# Patient Record
Sex: Male | Born: 1950 | Race: White | Hispanic: No | Marital: Married | Smoking: Never smoker
Health system: Southern US, Community
[De-identification: ages and names within clinical notes are randomized; demographics above are authoritative.]

## PROBLEM LIST (undated history)

## (undated) DIAGNOSIS — I10 Essential (primary) hypertension: Secondary | ICD-10-CM

## (undated) HISTORY — PX: KNEE SURGERY: SHX244

---

## 2005-10-04 ENCOUNTER — Encounter: Admission: RE | Admit: 2005-10-04 | Discharge: 2005-10-04 | Payer: Self-pay | Admitting: Family Medicine

## 2007-01-05 ENCOUNTER — Inpatient Hospital Stay (HOSPITAL_COMMUNITY): Admission: EM | Admit: 2007-01-05 | Discharge: 2007-01-06 | Payer: Self-pay | Admitting: Emergency Medicine

## 2007-01-05 ENCOUNTER — Encounter (INDEPENDENT_AMBULATORY_CARE_PROVIDER_SITE_OTHER): Payer: Self-pay | Admitting: Orthopedic Surgery

## 2010-03-12 ENCOUNTER — Encounter: Admission: RE | Admit: 2010-03-12 | Discharge: 2010-03-12 | Payer: Self-pay | Admitting: Internal Medicine

## 2010-10-19 NOTE — Op Note (Signed)
Kent Watts, VANOVER NO.:  [id number]   MEDICAL RECORD NO.:  [id number]          PATIENT TYPE:  INP   LOCATION:  1843                         FACILITY:  [facility]   PHYSICIAN:  Alvy Beal, MD    DATE OF BIRTH:  [birth year]   DATE OF PROCEDURE:  01/05/2007  DATE OF DISCHARGE:                               OPERATIVE REPORT   PREOPERATIVE DIAGNOSIS:  Right knee traumatic arthrotomy 2 days ago.   POSTOPERATIVE DIAGNOSIS:  Right knee traumatic arthrotomy 2 days ago.   OPERATIVE PROCEDURE:  I&D right knee.   COMPLICATIONS:  None.   CONDITION:  Stable.   HISTORY:  This is a very pleasant gentleman who 2 days ago suffered a  puncture wound to the right knee.  He was seen outside at urgent care.  The knee was washed out and closed.  He presents to the ER because of  increasing pain and some mild increased swelling.  A knee aspirate  revealed 5000 white blood cells and a hemarthrosis.  On the x-ray, there  was some evidence of some air inside the joint, and so we elected to  take him to the operating room for a formal I&D.  All appropriate risks,  benefits, and alternatives were explained to the patient.   OPERATIVE NOTE:  The patient was brought to the operating room and  placed supine on the operating table.  After successful induction of  general anesthesia with endotracheal intubation, the knee was prepped  and draped in a standard fashion.  A standard superomedial suprapatellar  portal was established using an 11 blade knife.  Once the trocar was  intraarticular, I then removed the sutures from the wound on the  inferior aspect of the patella and then extended the incision somewhat.  I then debrided the wound and then advanced trocar through the wound,  through the traumatic arthrotomy site and into the joint.  I then  irrigated the wound copiously with a total of 9 liters of saline through  the pump.  At no point in time was there any extravasation of fluid  into  the popliteal fossa.   Once the knee was washed out appropriately, I then irrigated the wound  itself and placed a drain into the joint.  I then loosely reapproximated  the wound edges.  Dry dressing and an Ace was applied, as was a knee  immobilizer.  The patient was then extubated and transferred to the PACU  without incident.  At the end of the case, all needle and sponge counts  were correct.      Alvy Beal, MD  Electronically Signed     DDB/MEDQ  D:  01/05/2007  T:  01/06/2007  Job:  [id number]

## 2010-10-19 NOTE — Op Note (Signed)
NAMEARLISS, FRISINA NO.:  [id number]   MEDICAL RECORD NO.:  [id number]          PATIENT TYPE:  INP   LOCATION:  1843                         FACILITY:  [facility]   PHYSICIAN:  Alvy Beal, MD    DATE OF BIRTH:  03-18-1951   DATE OF PROCEDURE:  DATE OF DISCHARGE:                               OPERATIVE REPORT   Audio too short to transcribe (less than 5 seconds)      Alvy Beal, MD     DDB/MEDQ  D:  01/05/2007  T:  01/05/2007  Job:  [id number]

## 2010-10-19 NOTE — Consult Note (Signed)
NAMEXYLON, CROOM NO.:  [id number]   MEDICAL RECORD NO.:  [id number]          PATIENT TYPE:  INP   LOCATION:  5029                         FACILITY:  [facility]   PHYSICIAN:  Alvy Beal, MD    DATE OF BIRTH:  May 20, 1951   DATE OF CONSULTATION:  01/05/2007  DATE OF DISCHARGE:                                 CONSULTATION   ORTHOPEDIC SURGERY CONSULTATION:   REASON FOR CONSULTATION:  Consultation requested per the ER staff for  evaluation of right knee injury.   HISTORY:  Kent Watts is a very pleasant gentleman who two days ago fell  from a step ladder and injured himself.  He had his knee strike a metal  object and injured his right knee.  As a result he was seen in the  [facility].  They irrigated out the anterior right knee injury  and sutured it with three sutures.  He then called my office today and  was told to be seen and presented to the emergency room.  I was  contacted by the ER staff concerning this.   At this point in time, his major concern is just some knee pain.  He  noted that there was swelling almost immediately afterwards in the knee  and he has been on OxyContin which has managed his pain.   According to his wife, he has never had any fevers or chills or  significant redness in the knee.   They did note that up until my evaluation, he has been having difficulty  bending the knee and he is currently ambulating with a walker.   PAST MEDICAL HISTORY:  Essentially unremarkable.  He denies any  significant medical problems.   PAST SURGICAL HISTORY:  Essentially unremarkable.   FAMILY HISTORY:  Essentially unremarkable.   CURRENT MEDICATIONS:  He is just taking the OxyContin and Zoloft.   SOCIAL HISTORY:  He denies any tobacco use or significant alcohol use.   CLINICAL EXAM:  GENERAL:  He is a pleasant gentleman who appears his  stated age.  EXTREMITIES:  He has a small anterior knee laceration that was repaired  with three  sutures.  It is clean.  It is dry and intact.  There is no  erythema.  I am able to flex and extend the knee passively, and he is  able to do it actively without any severe pain.  He does have difficulty  and pain when he weight bears.  NEUROLOGICAL:  His neurological exam is intact with tibialis anterior,  EHL and gastrocnemius 5/5.  Capillary refill less than 2 seconds.   PROCEDURE:  At this point in time, under sterile conditions, I aspirated  the knee and got 50 mL of blood.  The patient had a hemarthrosis, which  I have now aspirated.  There was no purulent material noted on the  aspiration.   PLAN:  At this point in time, the patient may have an intraarticular  meniscal or ACL injury causing his hemarthrosis.  At this point, I am  going to obtain some new x-rays of his knee and  an MRI of his knee.  I  have also sent the aspiration off for a stat Gram stain.  If it appears  to return back with bacteria or a significant white blood cell count,  then I will take him to the operating room for a formal I&D.  However,  clinically the knee does not appear to be infected.  He has no severe  pain with range of motion.  There is no significant tenderness with  palpation.  There is also no purulent drainage on aspiration.  At this  point we will continue to keep him n.p.o. until we get the Gram stain  and MRI results back.  Further recommendations pending the review of the  studies.      Alvy Beal, MD  Electronically Signed     DDB/MEDQ  D:  01/05/2007  T:  01/06/2007  Job:  [id number]

## 2010-10-22 NOTE — Discharge Summary (Signed)
NAMECLAUD, Kent Watts NO.:  [id number]   MEDICAL RECORD NO.:  [id number]          PATIENT TYPE:  INP   LOCATION:  5029                         FACILITY:  [facility]   PHYSICIAN:  Alvy Beal, MD    DATE OF BIRTH:  26-Mar-1951   DATE OF ADMISSION:  01/05/2007  DATE OF DISCHARGE:  01/06/2007                               DISCHARGE SUMMARY   PREOPERATIVE DIAGNOSIS:  Right knee traumatic arthrotomy.   POSTOPERATIVE DIAGNOSIS:  Right knee traumatic arthrotomy.   DISCHARGE DIAGNOSIS:  Right knee traumatic arthrotomy.  No other  significant medical comorbidities is noted.   HISTORY:  This is a very pleasant gentleman who presented to the  emergency room after suffering a puncture wound to the right knee.  He  indicates that this occurred two days prior to his admission.  There was  significant knee swelling, pain and difficulty with range of motion.  He  was seen at urgent care and then sent to the emergency room for further  evaluation.   A knee aspirate revealed 5000 white blood cells and hemarthrosis.  X-ray  showed some evidence of some air inside the joint.  Because of the  traumatic nature of the injury, I elected to take the patient to the  operating room for a formal I&D.   His admitting exam noted significant swelling and erythema, pain with  range of motion, but no gross instability with varus and valgus stress  testing, negative Lachman exam.  His neurovascular exam was intact with  EHL, tibialis, anterior gastrocnemius 5/5, sensation in the cast and  foot was intact, pulses in the dorsalis pedis and posterior tibialis  were intact.   All appropriate risks, benefits and alternatives were explained to the  patient and he was taken to the operating room on January 05, 2007 for an  I&D.   The patient tolerated this procedure well and was admitted to the floor  for IV antibiotic treatment.   His hospital course was essentially unremarkable.  He was  evaluated by  physical therapy on postop day two.  He was independently ambulating and  doing stairs, and he was discharged with no further acute physical  therapy needs.  He was noted to be afebrile throughout the course.  His  cultures remained negative.  He was discharged to home on p.o.  antibiotics and instructed to follow up with me as an outpatient.  Appropriate post discharge pain medications were provided.      Alvy Beal, MD  Electronically Signed     DDB/MEDQ  D:  02/13/2007  T:  02/13/2007  Job:  [id number]

## 2011-03-21 LAB — I-STAT 8, (EC8 V) (CONVERTED LAB)
BUN: 16
Bicarbonate: 29.6 — ABNORMAL HIGH
HCT: 45
Operator id: 272551
TCO2: 31
pCO2, Ven: 55.7 — ABNORMAL HIGH

## 2011-03-21 LAB — BODY FLUID CULTURE: Culture: NO GROWTH

## 2011-03-21 LAB — SYNOVIAL CELL COUNT + DIFF, W/ CRYSTALS
Eosinophils-Synovial: 0
Lymphocytes-Synovial Fld: 28 — ABNORMAL HIGH
Monocyte-Macrophage-Synovial Fluid: 11 — ABNORMAL LOW
Neutrophil, Synovial: 61 — ABNORMAL HIGH
WBC, Synovial: 5060 — ABNORMAL HIGH

## 2011-03-21 LAB — GRAM STAIN

## 2019-05-06 ENCOUNTER — Other Ambulatory Visit: Payer: Self-pay | Admitting: Physician Assistant

## 2019-05-06 ENCOUNTER — Other Ambulatory Visit: Payer: Self-pay

## 2019-05-06 ENCOUNTER — Ambulatory Visit
Admission: RE | Admit: 2019-05-06 | Discharge: 2019-05-06 | Disposition: A | Discharge status: Home / Self Care | Payer: Self-pay | Source: Ambulatory Visit | Attending: Physician Assistant | Admitting: Physician Assistant

## 2019-05-06 DIAGNOSIS — G8929 Other chronic pain: Secondary | ICD-10-CM

## 2019-06-11 ENCOUNTER — Ambulatory Visit: Payer: Medicare HMO | Attending: Internal Medicine

## 2019-06-11 DIAGNOSIS — Z20822 Contact with and (suspected) exposure to covid-19: Secondary | ICD-10-CM

## 2019-06-13 LAB — NOVEL CORONAVIRUS, NAA: SARS-CoV-2, NAA: NOT DETECTED

## 2019-07-13 ENCOUNTER — Ambulatory Visit: Payer: Medicare HMO | Attending: Internal Medicine

## 2019-07-13 DIAGNOSIS — Z23 Encounter for immunization: Secondary | ICD-10-CM

## 2019-07-13 NOTE — Progress Notes (Signed)
   Covid-19 Vaccination Clinic  Name:  Kent Watts    MRN: [id number] DOB: 1950/07/17  07/13/2019  [name] was observed post Covid-19 immunization for 15 minutes without incidence. He was provided with Vaccine Information Sheet and instruction to access the V-Safe system.   [name] was instructed to call 911 with any severe reactions post vaccine: Marland Kitchen Difficulty breathing  . Swelling of your face and throat  . A fast heartbeat  . A bad rash all over your body  . Dizziness and weakness    Immunizations Administered    Name Date Dose VIS Date Route   Pfizer COVID-19 Vaccine 07/13/2019  6:31 PM 0.3 mL 05/17/2019 Intramuscular   Manufacturer: [employer], [employer]   Lot: [id number]   NDC: [address]-2

## 2019-07-29 ENCOUNTER — Ambulatory Visit: Payer: Medicare HMO

## 2019-08-07 ENCOUNTER — Ambulatory Visit: Payer: Medicare HMO | Attending: Internal Medicine

## 2019-08-07 ENCOUNTER — Ambulatory Visit: Payer: Medicare HMO

## 2019-08-07 DIAGNOSIS — Z23 Encounter for immunization: Secondary | ICD-10-CM

## 2019-08-07 NOTE — Progress Notes (Signed)
   Covid-19 Vaccination Clinic  Name:  Kent Watts    MRN: [id number] DOB: [birth year]  08/07/2019  Kent Watts was observed post Covid-19 immunization for 15 minutes without incident. He was provided with Vaccine Information Sheet and instruction to access the V-Safe system.   Kent Watts was instructed to call 911 with any severe reactions post vaccine: Marland Kitchen Difficulty breathing  . Swelling of face and throat  . A fast heartbeat  . A bad rash all over body  . Dizziness and weakness   Immunizations Administered    Name Date Dose VIS Date Route   Pfizer COVID-19 Vaccine 08/07/2019  9:53 AM 0.3 mL 05/17/2019 Intramuscular   Manufacturer: [employer], [employer]   Lot: [id number]   NDC: [address]-6

## 2020-02-14 ENCOUNTER — Emergency Department (HOSPITAL_COMMUNITY)
Admission: EM | Admit: 2020-02-14 | Discharge: 2020-02-14 | Disposition: A | Discharge status: Home / Self Care | Payer: Medicare HMO | Attending: Emergency Medicine | Admitting: Emergency Medicine

## 2020-02-14 ENCOUNTER — Emergency Department (HOSPITAL_COMMUNITY): Payer: Medicare HMO

## 2020-02-14 ENCOUNTER — Other Ambulatory Visit: Payer: Self-pay

## 2020-02-14 ENCOUNTER — Encounter (HOSPITAL_COMMUNITY): Payer: Self-pay

## 2020-02-14 DIAGNOSIS — W010XXA Fall on same level from slipping, tripping and stumbling without subsequent striking against object, initial encounter: Secondary | ICD-10-CM | POA: Insufficient documentation

## 2020-02-14 DIAGNOSIS — W19XXXA Unspecified fall, initial encounter: Secondary | ICD-10-CM

## 2020-02-14 DIAGNOSIS — Y9289 Other specified places as the place of occurrence of the external cause: Secondary | ICD-10-CM | POA: Diagnosis not present

## 2020-02-14 DIAGNOSIS — I10 Essential (primary) hypertension: Secondary | ICD-10-CM | POA: Diagnosis not present

## 2020-02-14 DIAGNOSIS — R6884 Jaw pain: Secondary | ICD-10-CM | POA: Diagnosis not present

## 2020-02-14 DIAGNOSIS — Y999 Unspecified external cause status: Secondary | ICD-10-CM | POA: Diagnosis not present

## 2020-02-14 DIAGNOSIS — Y9301 Activity, walking, marching and hiking: Secondary | ICD-10-CM | POA: Diagnosis not present

## 2020-02-14 HISTORY — DX: Essential (primary) hypertension: I10

## 2020-02-14 NOTE — Discharge Instructions (Signed)
Take Tylenol and Ibuprofen as needed for pain. Jaw compress to jaw. Soft foods and increase diet as tolerated  Follow up with the surgeon if new or worsening symptoms

## 2020-02-14 NOTE — ED Provider Notes (Signed)
[facility]-EMERGENCY DEPT Provider Note   CSN: [id number] Arrival date & time: 02/14/20  1504    History Chief Complaint  Patient presents with  . Fall  . Jaw Pain   Kent Watts is a 69 y.o. male with past medical history significant for hypertension who presents for evaluation of mechanical fall.  Patient states 5 days ago he was walking his dog when he tripped and fell over his feet and landed and hit the mandible of his jaw on the ground.  Patient states since then he has had right-sided jaw pain.  Worse when he opens and closes his jaw.  He is unable to tolerate p.o. intake however with increased pain.  He has been taking 3 Advil twice daily for this.  He rates his current pain a 6/10.  He does not want anything for pain at this time.  Patient states he also has some tenderness to the bridge of his nose.  He denies any epistaxis, vision changes, loose dentition or any intraoral lacerations.  He denies any headache, neck pain, chest pain, shortness of breath, abdominal pain, diarrhea, dysuria, extremity pain, back pain. He denies any drooling, dysphagia or trismus.  Denies additional aggravating or alleviating factors.  He is not currently followed by maxillofacial surgery or ENT. He did recently have a tooth pulled by dentistry.  History obtained from patient and past medical records.  No interpreter used.  HPI     Past Medical History:  Diagnosis Date  . Hypertension     There are no problems to display for this patient.   Past Surgical History:  Procedure Laterality Date  . KNEE SURGERY Right        Family History  Problem Relation Age of Onset  . Diabetes Mother   . Cancer Father     Social History   Tobacco Use  . Smoking status: Never Smoker  . Smokeless tobacco: Never Used  Vaping Use  . Vaping Use: Never used  Substance Use Topics  . Alcohol use: Yes  . Drug use: Never    Home Medications Prior to Admission medications   Not on  File    Allergies    Patient has no known allergies.  Review of Systems   Review of Systems  Constitutional: Negative.   HENT: Negative for congestion, dental problem, ear pain, facial swelling, postnasal drip, rhinorrhea, sinus pressure, sinus pain, sore throat, trouble swallowing and voice change.        Right mandible pain  Respiratory: Negative.   Cardiovascular: Negative.   Gastrointestinal: Negative.   Genitourinary: Negative.   Musculoskeletal: Negative.   Skin: Negative.   Neurological: Negative.   All other systems reviewed and are negative.   Physical Exam Updated Vital Signs BP 140/70 (BP Location: Right Arm)   Pulse 66   Temp 99 F (37.2 C) (Oral)   Resp 16   Ht 5\' 11"  (1.803 m)   Wt 83.9 kg   SpO2 98%   BMI 25.80 kg/m   Physical Exam Vitals and nursing note reviewed.  Constitutional:      General: He is not in acute distress.    Appearance: He is well-developed. He is not ill-appearing, toxic-appearing or diaphoretic.  HENT:     Head: Normocephalic and atraumatic. No raccoon eyes, Battle's sign, abrasion, contusion or masses.     Jaw: Tenderness present. No trismus or swelling.      Comments: Opens fingers to 3 finger breaths.  Some tenderness over  right mandible.  Tongue midline.  No loose dentition.  No intraoral edema    Right Ear: No hemotympanum.     Left Ear: No hemotympanum.     Nose: Nasal tenderness present. No laceration, mucosal edema, congestion or rhinorrhea.     Right Nostril: No foreign body, epistaxis or septal hematoma.     Left Nostril: No foreign body, epistaxis, septal hematoma or occlusion.     Right Sinus: No frontal sinus tenderness.     Left Sinus: No frontal sinus tenderness.      Comments: Mild tenderness over midline nasal bridge.  He has no septal hematoma.  No crepitus or step-offs    Mouth/Throat:     Lips: Pink.     Mouth: Mucous membranes are moist.     Pharynx: Oropharynx is clear. Uvula midline.     Tonsils: 0 on  the right. 0 on the left.     Comments: No loose dentition.  Open fingers to 3 finger breaths.  Uvula midline.  Old ecchymosis to inner lower lip mucosa however no obvious lacerations. No periapical abscess Eyes:     Pupils: Pupils are equal, round, and reactive to light.  Neck:     Trachea: Trachea and phonation normal.     Comments: No neck stiffness or neck rigidity.  No midline cervical tenderness, crepitus or step-offs Cardiovascular:     Rate and Rhythm: Normal rate and regular rhythm.     Pulses: Normal pulses.          Radial pulses are 2+ on the right side and 2+ on the left side.       Dorsalis pedis pulses are 2+ on the right side and 2+ on the left side.       Posterior tibial pulses are 2+ on the right side and 2+ on the left side.     Heart sounds: Normal heart sounds.  Pulmonary:     Effort: Pulmonary effort is normal. No respiratory distress.     Breath sounds: Normal breath sounds and air entry.  Abdominal:     General: Bowel sounds are normal. There is no distension.     Palpations: Abdomen is soft.     Tenderness: There is no abdominal tenderness. There is no right CVA tenderness, left CVA tenderness, guarding or rebound. Negative signs include Murphy's sign and McBurney's sign.     Hernia: No hernia is present.  Musculoskeletal:        General: Normal range of motion.     Cervical back: Full passive range of motion without pain, normal range of motion and neck supple.     Comments: Moves all 4 extremities without difficulty. No bony tenderness to extremities.  Feet:     Right foot:     Skin integrity: Skin integrity normal.     Left foot:     Skin integrity: Skin integrity normal.  Lymphadenopathy:     Cervical: No cervical adenopathy.  Skin:    General: Skin is warm and dry.  Neurological:     Mental Status: He is alert.     Comments: CN 2-12 grossly intact No facial droop Intact sensation to BL upper and lower extremities. Ambulatory without difficulty      ED Results / Procedures / Treatments   Labs (all labs ordered are listed, but only abnormal results are displayed) Labs Reviewed - No data to display  EKG None  Radiology CT Maxillofacial Wo Contrast  Result Date: 02/14/2020 CLINICAL DATA:  Fell face forward, right-sided facial trauma EXAM: CT MAXILLOFACIAL WITHOUT CONTRAST TECHNIQUE: Multidetector CT imaging of the maxillofacial structures was performed. Multiplanar CT image reconstructions were also generated. COMPARISON:  None. FINDINGS: Osseous: No acute displaced fractures. Orbits: Negative. No traumatic or inflammatory finding. Sinuses: Clear. Soft tissues: Negative. Limited intracranial: No significant or unexpected finding. IMPRESSION: 1. No acute facial bone fracture. Electronically Signed   By: Sharlet Salina M.D.   On: 02/14/2020 17:08    Procedures Procedures (including critical care time)  Medications Ordered in ED Medications - No data to display  ED Course  I have reviewed the triage vital signs and the nursing notes.  Pertinent labs & imaging results that were available during my care of the patient were reviewed by me and considered in my medical decision making (see chart for details).  69 year old presents for evaluation after mechanical fall 5 days ago with subsequent right-sided mandible pain.  He is afebrile, nonseptic, non-ill-appearing.  He is able to open up his jaw to 3 finger breaths.  No drooling, dysphagia, trismus. No evidence of intraoral lacerations.  No loose dentition.  Has a nonfocal neuro exam without deficits.  No evidence of gross traumatic head injury.  His midline cervical spine is without tenderness, crepitus.  He has no bony extremity tenderness.  He is no musculoskeletal exam.  He has been able to tolerate p.o. intake at home without difficulty.  Plan on CT max face and reassess  CT max/ face without fracture, question soft tissue injury as cause of his pain. No evidence of acute infectious  process.  He is tolerating p.o. intake here in the ED without difficulty.  Discussed Tylenol, NSAIDs and close follow-up with ENT or max face surgery if his symptoms do not resolve. No CP, SOB, neck pain, dizziness, weakness to suggest atypical cardiac, vascular etiology of his pain.  The patient has been appropriately medically screened and/or stabilized in the ED. I have low suspicion for any other emergent medical condition which would require further screening, evaluation or treatment in the ED or require inpatient management.  Patient is hemodynamically stable and in no acute distress.  Patient able to ambulate in department prior to ED.  Evaluation does not show acute pathology that would require ongoing or additional emergent interventions while in the emergency department or further inpatient treatment.  I have discussed the diagnosis with the patient and answered all questions.  Pain is been managed while in the emergency department and patient has no further complaints prior to discharge.  Patient is comfortable with plan discussed in room and is stable for discharge at this time.  I have discussed strict return precautions for returning to the emergency department.  Patient was encouraged to follow-up with PCP/specialist refer to at discharge.    MDM Rules/Calculators/A&P                           Final Clinical Impression(s) / ED Diagnoses Final diagnoses:  Fall, initial encounter  Mandible pain    Rx / DC Orders ED Discharge Orders    None       Daquane Aguilar A, PA-C 02/14/20 1748    Linwood Dibbles, MD 02/15/20 1658

## 2020-02-14 NOTE — ED Triage Notes (Addendum)
Patient states he tripped on something while walking with his dogs 5 days ago. Patient states he fell face front on asphalt. Patient denies LOC.. Patient c/o nose, right jaw , and forehead pain. patient states some discomfort when opening his mouth. Patient states he has been wearing a stretchy head band at night to help with pain so he can sleep better at night.

## 2021-07-13 DIAGNOSIS — H52223 Regular astigmatism, bilateral: Secondary | ICD-10-CM | POA: Diagnosis not present

## 2021-07-13 DIAGNOSIS — H524 Presbyopia: Secondary | ICD-10-CM | POA: Diagnosis not present

## 2021-07-15 DIAGNOSIS — H524 Presbyopia: Secondary | ICD-10-CM | POA: Diagnosis not present

## 2021-07-15 DIAGNOSIS — H52223 Regular astigmatism, bilateral: Secondary | ICD-10-CM | POA: Diagnosis not present

## 2021-08-23 ENCOUNTER — Emergency Department (HOSPITAL_BASED_OUTPATIENT_CLINIC_OR_DEPARTMENT_OTHER): Payer: Medicare HMO

## 2021-08-23 ENCOUNTER — Emergency Department (HOSPITAL_BASED_OUTPATIENT_CLINIC_OR_DEPARTMENT_OTHER)
Admission: EM | Admit: 2021-08-23 | Discharge: 2021-08-23 | Disposition: A | Discharge status: Home / Self Care | Payer: Medicare HMO | Attending: Emergency Medicine | Admitting: Emergency Medicine

## 2021-08-23 ENCOUNTER — Encounter (HOSPITAL_BASED_OUTPATIENT_CLINIC_OR_DEPARTMENT_OTHER): Payer: Self-pay

## 2021-08-23 ENCOUNTER — Other Ambulatory Visit: Payer: Self-pay

## 2021-08-23 DIAGNOSIS — K529 Noninfective gastroenteritis and colitis, unspecified: Secondary | ICD-10-CM | POA: Insufficient documentation

## 2021-08-23 DIAGNOSIS — R112 Nausea with vomiting, unspecified: Secondary | ICD-10-CM | POA: Diagnosis not present

## 2021-08-23 DIAGNOSIS — N200 Calculus of kidney: Secondary | ICD-10-CM | POA: Diagnosis not present

## 2021-08-23 DIAGNOSIS — D72829 Elevated white blood cell count, unspecified: Secondary | ICD-10-CM | POA: Diagnosis not present

## 2021-08-23 DIAGNOSIS — R000 Tachycardia, unspecified: Secondary | ICD-10-CM | POA: Diagnosis not present

## 2021-08-23 LAB — COMPREHENSIVE METABOLIC PANEL
ALT: 19 U/L (ref 0–44)
AST: 18 U/L (ref 15–41)
Albumin: 4.9 g/dL (ref 3.5–5.0)
Alkaline Phosphatase: 92 U/L (ref 38–126)
Anion gap: 13 (ref 5–15)
BUN: 22 mg/dL (ref 8–23)
CO2: 26 mmol/L (ref 22–32)
Calcium: 10 mg/dL (ref 8.9–10.3)
Chloride: 100 mmol/L (ref 98–111)
Creatinine, Ser: 1.17 mg/dL (ref 0.61–1.24)
GFR, Estimated: >60 mL/min (ref >60)
Glucose, Bld: 139 mg/dL — ABNORMAL HIGH (ref 70–99)
Potassium: 3.5 mmol/L (ref 3.5–5.1)
Sodium: 139 mmol/L (ref 135–145)
Total Bilirubin: 1.2 mg/dL (ref 0.3–1.2)
Total Protein: 8.1 g/dL (ref 6.5–8.1)

## 2021-08-23 LAB — URINALYSIS, ROUTINE W REFLEX MICROSCOPIC
Bilirubin Urine: NEGATIVE
Glucose, UA: NEGATIVE mg/dL
Hgb urine dipstick: NEGATIVE
Ketones, ur: 40 mg/dL — AB
Leukocytes,Ua: NEGATIVE
Nitrite: NEGATIVE
Protein, ur: 30 mg/dL — AB
Specific Gravity, Urine: 1.031 — ABNORMAL HIGH (ref 1.005–1.030)
pH: 6 (ref 5.0–8.0)

## 2021-08-23 LAB — CBC
HCT: 46.5 % (ref 39.0–52.0)
Hemoglobin: 16.3 g/dL (ref 13.0–17.0)
MCH: 31.8 pg (ref 26.0–34.0)
MCHC: 35.1 g/dL (ref 30.0–36.0)
MCV: 90.6 fL (ref 80.0–100.0)
Platelets: 265 10*3/uL (ref 150–400)
RBC: 5.13 MIL/uL (ref 4.22–5.81)
RDW: 12.7 % (ref 11.5–15.5)
WBC: 15.5 10*3/uL — ABNORMAL HIGH (ref 4.0–10.5)
nRBC: 0 % (ref 0.0–0.2)

## 2021-08-23 LAB — LIPASE, BLOOD: Lipase: 10 U/L — ABNORMAL LOW (ref 11–51)

## 2021-08-23 MED ORDER — ONDANSETRON HCL 4 MG/2ML IJ SOLN
4.0000 mg | Freq: Once | INTRAMUSCULAR | Status: AC
  Administered 2021-08-23: 4 mg via INTRAVENOUS
  Filled 2021-08-23: qty 2

## 2021-08-23 MED ORDER — LOPERAMIDE HCL 2 MG PO CAPS
2.0000 mg | ORAL_CAPSULE | Freq: Four times a day (QID) | ORAL | 0 refills | Status: AC | PRN
Start: 2021-08-23 — End: ?

## 2021-08-23 MED ORDER — IOHEXOL 300 MG/ML  SOLN
100.0000 mL | Freq: Once | INTRAMUSCULAR | Status: AC | PRN
  Administered 2021-08-23: 100 mL via INTRAVENOUS

## 2021-08-23 MED ORDER — ONDANSETRON 8 MG PO TBDP: 8.0000 mg | ORAL_TABLET | Freq: Three times a day (TID) | ORAL | 0 refills | Status: DC | PRN

## 2021-08-23 MED ORDER — SODIUM CHLORIDE 0.9 % IV BOLUS (SEPSIS)
1000.0000 mL | Freq: Once | INTRAVENOUS | Status: AC
Start: 2021-08-23 — End: 2021-08-23
  Administered 2021-08-23: 1000 mL via INTRAVENOUS

## 2021-08-23 MED ORDER — SODIUM CHLORIDE 0.9 % IV SOLN: 1000.0000 mL | INTRAVENOUS | Status: DC

## 2021-08-23 NOTE — ED Triage Notes (Signed)
Patient here POV from Home with N/V/D.  ? ?Patient endorses Constant N/V/D. For approximately 12 hours. Constant since it began. ? ?No Fevers. Seen at [facility] and sent for Evaluation.  ? ?NAD Noted during Triage. A&Ox4. GCS 15. Ambulatory.  ?

## 2021-08-23 NOTE — Discharge Instructions (Signed)
Take the medications as needed for vomiting and diarrhea.  Follow-up with your doctor to be rechecked if the symptoms do not resolve in the next couple of days.  Return as needed for worsening symptoms ?

## 2021-08-23 NOTE — ED Provider Notes (Signed)
?[facility] EMERGENCY DEPT ?Provider Note ? ? ?CSN: [id number] ?Arrival date & time: 08/23/21  1721 ? ?  ? ?History ? ?Chief Complaint  ?Patient presents with  ? Emesis  ? ? ?Kent Watts is a 71 y.o. male. ? ? ?Emesis ? ?Patient presents to the ED for evaluation of nausea vomiting and diarrhea.  Patient states the symptoms started about 12 hours ago.  He has had numerous episodes of vomiting and diarrhea approximately every 30 minutes for the last 12 hours.  He denies any fevers.  He does complain of abdominal pain mostly when vomiting.  He has not noticed any blood in his stool or his emesis.  Patient to an urgent care today.  I reviewed the notes of that visit.  He was given a dose of Zofran and they recommended he go to the ER for further evaluation as he was noted to be tachycardic. ? ?Patient denies any abdominal surgeries.  No recent antibiotics. ? ?Home Medications ?Prior to Admission medications   ?Medication Sig Start Date End Date Taking? Authorizing Provider  ?loperamide (IMODIUM) 2 MG capsule Take 1 capsule (2 mg total) by mouth 4 (four) times daily as needed for diarrhea or loose stools. 08/23/21  Yes Linwood DibblesKnapp, Teon Hudnall, MD  ?ondansetron (ZOFRAN-ODT) 8 MG disintegrating tablet Take 1 tablet (8 mg total) by mouth every 8 (eight) hours as needed for nausea or vomiting. 08/23/21  Yes Linwood DibblesKnapp, Brieana Shimmin, MD  ?   ? ?Allergies    ?Lisinopril   ? ?Review of Systems   ?Review of Systems  ?Gastrointestinal:  Positive for vomiting.  ?All other systems reviewed and are negative. ? ?Physical Exam ?Updated Vital Signs ?BP (!) 144/79   Pulse 89   Temp 98.4 ?F (36.9 ?C) (Oral)   Resp 18   Ht 1.803 m (5\' 11" )   Wt 83.9 kg   SpO2 98%   BMI 25.80 kg/m?  ?Physical Exam ?Vitals and nursing note reviewed.  ?Constitutional:   ?   Appearance: He is well-developed. He is not diaphoretic.  ?HENT:  ?   Head: Normocephalic and atraumatic.  ?   Right Ear: External ear normal.  ?   Left Ear: External ear normal.  ?Eyes:  ?    General: No scleral icterus.    ?   Right eye: No discharge.     ?   Left eye: No discharge.  ?   Conjunctiva/sclera: Conjunctivae normal.  ?Neck:  ?   Trachea: No tracheal deviation.  ?Cardiovascular:  ?   Rate and Rhythm: Normal rate and regular rhythm.  ?Pulmonary:  ?   Effort: Pulmonary effort is normal. No respiratory distress.  ?   Breath sounds: Normal breath sounds. No stridor. No wheezing or rales.  ?Abdominal:  ?   General: Bowel sounds are normal. There is no distension.  ?   Palpations: Abdomen is soft.  ?   Tenderness: There is abdominal tenderness. There is no guarding or rebound.  ?   Comments: Mild generalized tenderness  ?Musculoskeletal:     ?   General: No tenderness or deformity.  ?   Cervical back: Neck supple.  ?Skin: ?   General: Skin is warm and dry.  ?   Findings: No rash.  ?Neurological:  ?   General: No focal deficit present.  ?   Mental Status: He is alert.  ?   Cranial Nerves: No cranial nerve deficit (no facial droop, extraocular movements intact, no slurred speech).  ?   Sensory:  No sensory deficit.  ?   Motor: No abnormal muscle tone or seizure activity.  ?   Coordination: Coordination normal.  ?Psychiatric:     ?   Mood and Affect: Mood normal.  ? ? ?ED Results / Procedures / Treatments   ?Labs ?(all labs ordered are listed, but only abnormal results are displayed) ?Labs Reviewed  ?LIPASE, BLOOD - Abnormal; Notable for the following components:  ?    Result Value  ? Lipase 10 (*)   ? All other components within normal limits  ?COMPREHENSIVE METABOLIC PANEL - Abnormal; Notable for the following components:  ? Glucose, Bld 139 (*)   ? All other components within normal limits  ?CBC - Abnormal; Notable for the following components:  ? WBC 15.5 (*)   ? All other components within normal limits  ?URINALYSIS, ROUTINE W REFLEX MICROSCOPIC - Abnormal; Notable for the following components:  ? APPearance CLOUDY (*)   ? Specific Gravity, Urine 1.031 (*)   ? Ketones, ur 40 (*)   ? Protein, ur 30  (*)   ? Bacteria, UA RARE (*)   ? Crystals PRESENT (*)   ? All other components within normal limits  ? ? ?EKG ?None ? ?Radiology ?CT ABDOMEN PELVIS W CONTRAST ? ?Result Date: 08/23/2021 ?CLINICAL DATA:  Abdominal pain, acute, nonlocalized EXAM: CT ABDOMEN AND PELVIS WITH CONTRAST TECHNIQUE: Multidetector CT imaging of the abdomen and pelvis was performed using the standard protocol following bolus administration of intravenous contrast. RADIATION DOSE REDUCTION: This exam was performed according to the departmental dose-optimization program which includes automated exposure control, adjustment of the mA and/or kV according to patient size and/or use of iterative reconstruction technique. CONTRAST:  OMNIPAQUE IOHEXOL 300 MG/ML  SOLN COMPARISON:  None. FINDINGS: Lower chest: No acute abnormality. Small fat containing left Bochdalek hernia. Hepatobiliary: No focal liver abnormality. No gallstones, gallbladder wall thickening, or pericholecystic fluid. No biliary dilatation. Pancreas: No focal lesion. Normal pancreatic contour. No surrounding inflammatory changes. No main pancreatic ductal dilatation. Spleen: Normal in size without focal abnormality. Adrenals/Urinary Tract: No adrenal nodule bilaterally. Bilateral kidneys enhance symmetrically. Punctate calcification within the right kidney. No left nephrolithiasis. No hydronephrosis. No hydroureter. The urinary bladder is unremarkable. Stomach/Bowel: Stomach is within normal limits. No evidence of bowel wall thickening or dilatation. Appendix appears normal. Vascular/Lymphatic: No abdominal aorta or iliac aneurysm. Mild to moderate atherosclerotic plaque of the aorta and its branches. No abdominal, pelvic, or inguinal lymphadenopathy. Reproductive: Prominent prostate measuring up to 4.7 cm. Other: No intraperitoneal free fluid. No intraperitoneal free gas. No organized fluid collection. Musculoskeletal: No abdominal wall hernia or abnormality. No suspicious lytic  or blastic osseous lesions. No acute displaced fracture. IMPRESSION: 1. Nonobstructive punctate right nephrolithiasis. 2. Prominent prostate measuring up to 4.7 cm. 3.  Aortic Atherosclerosis (ICD10-I70.0). Electronically Signed   By: Tish Frederickson M.D.   On: 08/23/2021 22:34   ? ?Procedures ?Procedures  ? ? ?Medications Ordered in ED ?Medications  ?sodium chloride 0.9 % bolus 1,000 mL (1,000 mLs Intravenous New Bag/Given 08/23/21 2151)  ?  Followed by  ?0.9 %  sodium chloride infusion (has no administration in time range)  ?ondansetron [facility]) injection 4 mg (4 mg Intravenous Given 08/23/21 2151)  ?iohexol (OMNIPAQUE) 300 MG/ML solution 100 mL (100 mLs Intravenous Contrast Given 08/23/21 2201)  ? ? ?ED Course/ Medical Decision Making/ A&P ?Clinical Course as of 08/23/21 2314  ?Mon Aug 23, 2021  ?2131 CBC(!) ?Elevated wbc [JK]  ?2131 Urinalysis, Routine w reflex microscopic  Urine, Clean Catch(!) ?nl [JK]  ?2132 Lipase, blood(!) ?nl [JK]  ?2132 Comprehensive metabolic panel(!) ?nl [JK]  ?2249 CT ABDOMEN PELVIS W CONTRAST ?CT scan findings reviewed, no signs of obstruction or colitis [JK]  ?  ?Clinical Course User Index ?[JK] Linwood Dibbles, MD  ? ?                        ?Medical Decision Making ?Amount and/or Complexity of Data Reviewed ?Labs: ordered. Decision-making details documented in ED Course. ?Radiology: ordered. Decision-making details documented in ED Course. ? ?Risk ?Prescription drug management. ? ? ?Patient presented to the ED for evaluation of vomiting and diarrhea.  Patient noted to have leukocytosis.  CT scan was performed to evaluate for the possibility of colitis diverticulitis obstruction.  CT scan fortunately does not show any acute findings.  Patient was given IV fluids and antiemetics.  Symptoms have improved and he is feeling better. ? ?Evaluation and diagnostic testing in the emergency department does not suggest an emergent condition requiring admission or immediate intervention beyond what has  been performed at this time.  The patient is safe for discharge and has been instructed to return immediately for worsening symptoms, change in symptoms or any other concerns. ? ? ? ? ? ? ? ? ?Final Clinical

## 2021-08-24 DIAGNOSIS — I491 Atrial premature depolarization: Secondary | ICD-10-CM | POA: Diagnosis not present

## 2021-08-24 DIAGNOSIS — I444 Left anterior fascicular block: Secondary | ICD-10-CM | POA: Diagnosis not present
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
## 2022-01-14 DIAGNOSIS — E782 Mixed hyperlipidemia: Secondary | ICD-10-CM | POA: Diagnosis not present

## 2022-01-14 DIAGNOSIS — R7303 Prediabetes: Secondary | ICD-10-CM | POA: Diagnosis not present

## 2022-01-14 DIAGNOSIS — I10 Essential (primary) hypertension: Secondary | ICD-10-CM | POA: Diagnosis not present

## 2022-01-19 DIAGNOSIS — R296 Repeated falls: Secondary | ICD-10-CM | POA: Diagnosis not present

## 2022-01-19 DIAGNOSIS — E782 Mixed hyperlipidemia: Secondary | ICD-10-CM | POA: Diagnosis not present

## 2022-01-19 DIAGNOSIS — F3341 Major depressive disorder, recurrent, in partial remission: Secondary | ICD-10-CM | POA: Diagnosis not present

## 2022-01-19 DIAGNOSIS — L219 Seborrheic dermatitis, unspecified: Secondary | ICD-10-CM | POA: Diagnosis not present

## 2022-01-19 DIAGNOSIS — K219 Gastro-esophageal reflux disease without esophagitis: Secondary | ICD-10-CM | POA: Diagnosis not present

## 2022-01-19 DIAGNOSIS — R29898 Other symptoms and signs involving the musculoskeletal system: Secondary | ICD-10-CM | POA: Diagnosis not present

## 2022-01-19 DIAGNOSIS — N400 Enlarged prostate without lower urinary tract symptoms: Secondary | ICD-10-CM | POA: Diagnosis not present

## 2022-01-19 DIAGNOSIS — R7301 Impaired fasting glucose: Secondary | ICD-10-CM | POA: Diagnosis not present

## 2022-01-19 DIAGNOSIS — F419 Anxiety disorder, unspecified: Secondary | ICD-10-CM | POA: Diagnosis not present

## 2022-01-19 DIAGNOSIS — I10 Essential (primary) hypertension: Secondary | ICD-10-CM | POA: Diagnosis not present

## 2022-01-19 DIAGNOSIS — R972 Elevated prostate specific antigen [PSA]: Secondary | ICD-10-CM | POA: Diagnosis not present

## 2022-01-19 DIAGNOSIS — H539 Unspecified visual disturbance: Secondary | ICD-10-CM | POA: Diagnosis not present

## 2022-01-19 DIAGNOSIS — Z125 Encounter for screening for malignant neoplasm of prostate: Secondary | ICD-10-CM | POA: Diagnosis not present

## 2022-03-29 ENCOUNTER — Emergency Department (HOSPITAL_COMMUNITY)
Admission: EM | Admit: 2022-03-29 | Discharge: 2022-03-30 | Disposition: A | Discharge status: Home / Self Care | Payer: Medicare HMO | Attending: Emergency Medicine | Admitting: Emergency Medicine

## 2022-03-29 ENCOUNTER — Other Ambulatory Visit: Payer: Self-pay

## 2022-03-29 ENCOUNTER — Emergency Department (HOSPITAL_COMMUNITY): Payer: Medicare HMO

## 2022-03-29 ENCOUNTER — Encounter (HOSPITAL_COMMUNITY): Payer: Self-pay

## 2022-03-29 DIAGNOSIS — I609 Nontraumatic subarachnoid hemorrhage, unspecified: Secondary | ICD-10-CM | POA: Diagnosis not present

## 2022-03-29 DIAGNOSIS — S0101XA Laceration without foreign body of scalp, initial encounter: Secondary | ICD-10-CM | POA: Diagnosis not present

## 2022-03-29 DIAGNOSIS — I10 Essential (primary) hypertension: Secondary | ICD-10-CM | POA: Diagnosis not present

## 2022-03-29 DIAGNOSIS — W19XXXA Unspecified fall, initial encounter: Secondary | ICD-10-CM | POA: Diagnosis not present

## 2022-03-29 DIAGNOSIS — S066X0A Traumatic subarachnoid hemorrhage without loss of consciousness, initial encounter: Secondary | ICD-10-CM | POA: Insufficient documentation

## 2022-03-29 DIAGNOSIS — Y92812 Truck as the place of occurrence of the external cause: Secondary | ICD-10-CM | POA: Insufficient documentation

## 2022-03-29 DIAGNOSIS — M542 Cervicalgia: Secondary | ICD-10-CM | POA: Diagnosis not present

## 2022-03-29 DIAGNOSIS — R519 Headache, unspecified: Secondary | ICD-10-CM | POA: Diagnosis not present

## 2022-03-29 DIAGNOSIS — W1789XA Other fall from one level to another, initial encounter: Secondary | ICD-10-CM | POA: Diagnosis not present

## 2022-03-29 DIAGNOSIS — Z23 Encounter for immunization: Secondary | ICD-10-CM | POA: Diagnosis not present

## 2022-03-29 DIAGNOSIS — S0990XA Unspecified injury of head, initial encounter: Secondary | ICD-10-CM | POA: Diagnosis not present

## 2022-03-29 DIAGNOSIS — M4312 Spondylolisthesis, cervical region: Secondary | ICD-10-CM | POA: Diagnosis not present

## 2022-03-29 DIAGNOSIS — Z743 Need for continuous supervision: Secondary | ICD-10-CM | POA: Diagnosis not present

## 2022-03-29 DIAGNOSIS — M545 Low back pain, unspecified: Secondary | ICD-10-CM | POA: Diagnosis not present

## 2022-03-29 DIAGNOSIS — M5136 Other intervertebral disc degeneration, lumbar region: Secondary | ICD-10-CM | POA: Diagnosis not present

## 2022-03-29 LAB — CBC WITH DIFFERENTIAL/PLATELET
Abs Immature Granulocytes: 0.06 10*3/uL (ref 0.00–0.07)
Basophils Absolute: 0.1 10*3/uL (ref 0.0–0.1)
Basophils Relative: 0 %
Eosinophils Absolute: 0.2 10*3/uL (ref 0.0–0.5)
Eosinophils Relative: 1 %
HCT: 40.5 % (ref 39.0–52.0)
Hemoglobin: 14.7 g/dL (ref 13.0–17.0)
Immature Granulocytes: 0 %
Lymphocytes Relative: 7 %
Lymphs Abs: 1.2 10*3/uL (ref 0.7–4.0)
MCH: 33.2 pg (ref 26.0–34.0)
MCHC: 36.3 g/dL — ABNORMAL HIGH (ref 30.0–36.0)
MCV: 91.4 fL (ref 80.0–100.0)
Monocytes Absolute: 0.9 10*3/uL (ref 0.1–1.0)
Monocytes Relative: 5 %
Neutro Abs: 14.3 10*3/uL — ABNORMAL HIGH (ref 1.7–7.7)
Neutrophils Relative %: 87 %
Platelets: 250 10*3/uL (ref 150–400)
RBC: 4.43 MIL/uL (ref 4.22–5.81)
RDW: 12.7 % (ref 11.5–15.5)
WBC: 16.7 10*3/uL — ABNORMAL HIGH (ref 4.0–10.5)
nRBC: 0 % (ref 0.0–0.2)

## 2022-03-29 MED ORDER — TETANUS-DIPHTH-ACELL PERTUSSIS 5-2.5-18.5 LF-MCG/0.5 IM SUSY
0.5000 mL | PREFILLED_SYRINGE | Freq: Once | INTRAMUSCULAR | Status: AC
  Administered 2022-03-30: 0.5 mL via INTRAMUSCULAR
  Filled 2022-03-29: qty 0.5

## 2022-03-29 NOTE — ED Provider Triage Note (Signed)
Emergency Medicine Provider Triage Evaluation Note  Kareen Hitsman , a 71 y.o. male with history of HTN was evaluated in triage.  Patient was brought in after fall approximately 5 feet to the ground from loading dock and head injury with unknown loss of consciousness.  He is not on anticoagulation.  He was noted to have a posterior head laceration.  Review of Systems  Positive: Headache Negative: Chest pain, shortness of breath, numbness or tingling  Physical Exam  BP (!) 150/77 (BP Location: Right Arm)   Pulse 63   Temp 98.2 F (36.8 C) (Oral)   Resp 11   SpO2 98%  Gen:   Awake, no distress  , acting appropriately Resp:  Normal effort satting 98% on room air MSK:   Moves extremities without difficulty  Other:  Small irregular posterior head laceration hemostatic, no midline tenderness of the C/T/L-spine  Medical Decision Making  Medically screening exam initiated at 10:48 PM.  Appropriate orders placed.  Netanel Yannuzzi was informed that the remainder of the evaluation will be completed by another provider, this initial triage assessment does not replace that evaluation, and the importance of remaining in the ED until their evaluation is complete.     Elgie Congo, MD 03/29/22 2249

## 2022-03-29 NOTE — ED Triage Notes (Addendum)
Fall ~61ft from a box truck Striking posterior area of head on ground. Denies LOC. Denies anticoagulants. Sts he takes several medications but not sure which ones. Denies pain. Arrives in rigid cervical collar.   Estimated that he remained on the ground 10 minutes. Bleeding controlled. Unable to determine size of laceration.   A/o to person,place, situation but not time. Repetitive speaking.

## 2022-03-30 ENCOUNTER — Emergency Department (HOSPITAL_COMMUNITY): Payer: Medicare HMO

## 2022-03-30 DIAGNOSIS — M545 Low back pain, unspecified: Secondary | ICD-10-CM | POA: Diagnosis not present

## 2022-03-30 DIAGNOSIS — M4312 Spondylolisthesis, cervical region: Secondary | ICD-10-CM | POA: Diagnosis not present

## 2022-03-30 DIAGNOSIS — M5136 Other intervertebral disc degeneration, lumbar region: Secondary | ICD-10-CM | POA: Diagnosis not present

## 2022-03-30 DIAGNOSIS — M542 Cervicalgia: Secondary | ICD-10-CM | POA: Diagnosis not present

## 2022-03-30 DIAGNOSIS — R519 Headache, unspecified: Secondary | ICD-10-CM | POA: Diagnosis not present

## 2022-03-30 LAB — BASIC METABOLIC PANEL
Anion gap: 8 (ref 5–15)
BUN: 19 mg/dL (ref 8–23)
CO2: 24 mmol/L (ref 22–32)
Calcium: 9.1 mg/dL (ref 8.9–10.3)
Chloride: 106 mmol/L (ref 98–111)
Creatinine, Ser: 1.19 mg/dL (ref 0.61–1.24)
GFR, Estimated: >60 mL/min (ref >60)
Glucose, Bld: 179 mg/dL — ABNORMAL HIGH (ref 70–99)
Potassium: 3.8 mmol/L (ref 3.5–5.1)
Sodium: 138 mmol/L (ref 135–145)

## 2022-03-30 MED ORDER — LIDOCAINE-EPINEPHRINE (PF) 2 %-1:200000 IJ SOLN
10.0000 mL | Freq: Once | INTRAMUSCULAR | Status: AC
  Administered 2022-03-30: 10 mL
  Filled 2022-03-30: qty 20

## 2022-03-30 NOTE — Discharge Instructions (Addendum)
The scan showed a very small amount of bleeding outside your brain that is essentially bruising from the fall.  This should resolve on its own and does not need any specific follow-up.  If you develop progressively worsening headache, confusion or stroke symptoms, however, you need to call 911 to come back and be reevaluated for worsening bleeding.  Use Tylenol as needed for headache.  Avoid ibuprofen/NSAIDs for the next few days.  Staples will need to be taken out in 10 days.  Follow-up at urgent care for removal.
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
## 2022-04-08 ENCOUNTER — Encounter (HOSPITAL_COMMUNITY): Payer: Self-pay | Admitting: Emergency Medicine

## 2022-04-08 ENCOUNTER — Ambulatory Visit (HOSPITAL_COMMUNITY)
Admission: EM | Admit: 2022-04-08 | Discharge: 2022-04-08 | Disposition: A | Discharge status: Home / Self Care | Payer: Medicare HMO

## 2022-04-08 DIAGNOSIS — S0101XD Laceration without foreign body of scalp, subsequent encounter: Secondary | ICD-10-CM | POA: Diagnosis not present

## 2022-04-08 NOTE — ED Triage Notes (Signed)
Pt here to get out 3 staples out of posterior head from fall on 10/24 that were placed in [place]. Site healing good with no s/s of infection.  Wells Guiles PA assessed staple site.

## 2022-05-04 DIAGNOSIS — R972 Elevated prostate specific antigen [PSA]: Secondary | ICD-10-CM | POA: Diagnosis not present

## 2022-05-04 DIAGNOSIS — Z0000 Encounter for general adult medical examination without abnormal findings: Secondary | ICD-10-CM | POA: Diagnosis not present

## 2022-05-04 DIAGNOSIS — Z125 Encounter for screening for malignant neoplasm of prostate: Secondary | ICD-10-CM | POA: Diagnosis not present

## 2022-05-04 DIAGNOSIS — Z23 Encounter for immunization: Secondary | ICD-10-CM | POA: Diagnosis not present

## 2022-06-07 DIAGNOSIS — H524 Presbyopia: Secondary | ICD-10-CM | POA: Diagnosis not present

## 2022-06-07 DIAGNOSIS — H25813 Combined forms of age-related cataract, bilateral: Secondary | ICD-10-CM | POA: Diagnosis not present

## 2022-06-07 DIAGNOSIS — H52203 Unspecified astigmatism, bilateral: Secondary | ICD-10-CM | POA: Diagnosis not present

## 2022-06-07 DIAGNOSIS — Z9181 History of falling: Secondary | ICD-10-CM | POA: Diagnosis not present

## 2022-06-07 DIAGNOSIS — H5213 Myopia, bilateral: Secondary | ICD-10-CM | POA: Diagnosis not present

## 2022-06-15 DIAGNOSIS — H2511 Age-related nuclear cataract, right eye: Secondary | ICD-10-CM | POA: Diagnosis not present

## 2022-06-15 DIAGNOSIS — H25811 Combined forms of age-related cataract, right eye: Secondary | ICD-10-CM | POA: Diagnosis not present

## 2022-06-15 DIAGNOSIS — H2512 Age-related nuclear cataract, left eye: Secondary | ICD-10-CM | POA: Diagnosis not present

## 2022-06-15 DIAGNOSIS — H25012 Cortical age-related cataract, left eye: Secondary | ICD-10-CM | POA: Diagnosis not present

## 2022-06-22 DIAGNOSIS — H25812 Combined forms of age-related cataract, left eye: Secondary | ICD-10-CM | POA: Diagnosis not present

## 2022-06-22 DIAGNOSIS — H2512 Age-related nuclear cataract, left eye: Secondary | ICD-10-CM | POA: Diagnosis not present

## 2022-08-29 NOTE — Therapy (Unsigned)
OUTPATIENT PHYSICAL THERAPY LOWER EXTREMITY EVALUATION   Patient Name: Kent Watts MRN: [id number] DOB:1950/12/29, 72 y.o., male Today's Date: 08/30/2022  END OF SESSION:  PT End of Session - 08/30/22 1408     Visit Number 1    Number of Visits 12    Date for PT Re-Evaluation 10/11/22    Authorization Type [employer] MEDICARE    PT Start Time 1230    PT Stop Time 1315    PT Time Calculation (min) 45 min    Activity Tolerance Patient tolerated treatment well    Behavior During Therapy WFL for tasks assessed/performed             Past Medical History:  Diagnosis Date   Hypertension    Past Surgical History:  Procedure Laterality Date   KNEE SURGERY Right    There are no problems to display for this patient.   REFERRING PROVIDER: Heywood Bene, PA-C   REFERRING DIAG: Repeated falls [R29.6], Personal history of fall [Z91.81], Other symptoms and signs involving the musculoskeletal system [R29.898], Other musculoskeletal symptoms referable to limbs(729.89) [R29.898]   THERAPY DIAG:  Other low back pain  Muscle weakness (generalized)  Other abnormalities of gait and mobility  Abnormal posture  Rationale for Evaluation and Treatment: Rehabilitation  ONSET DATE: Over a year ago is when he started falling   SUBJECTIVE:   SUBJECTIVE STATEMENT: Pt reports his first fall over a year ago walking his dogs. Other reported falls; off ladders, walking off his truck backwards, and tripping over objects. He has lost consciousness a few times due to falls. Most recent was when walking off the back of his truck resulting in a concussion, R shoulder pain, and lower back pain. He states that he has a history of bilat eye issues that has since been resolved with surgery. Pt was unable to see directly in front of him, describing it as walking in a "fun bubble". Pt notes weakness in bilat ankles. He has gotten high ankle boots that have also been helpful. I have a "slow gait with  increased visual input needed". 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   PERTINENT HISTORY: HTN PAIN:  Are you having pain? Yes: NPRS scale: 2-3/10 Pain location: Bilat shoulders, R knee  Pain description: Achiness,  Aggravating factors: Decreased activity,  Relieving factors: CBD oil  PRECAUTIONS: Fall  WEIGHT BEARING RESTRICTIONS: No  FALLS:  Has patient fallen in last 6 months? Yes. Number of falls 2  LIVING ENVIRONMENT: Lives with: lives with their spouse Lives in: House/apartment Stairs: Yes: External: 3 steps; none Has following equipment at home: Single point cane, Walker - 4 wheeled, and shower chair  OCCUPATION: Retired.   PLOF: Independent  PATIENT GOALS: Improve balance, strength and activity tolerance.   OBJECTIVE:   DIAGNOSTIC FINDINGS: 1. No acute fracture. 2. 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 3. Nonobstructive right renal calculus.  COGNITION: Overall cognitive status: Within functional limits for tasks assessed     SENSATION: WFL  POSTURE: rounded shoulders, forward head, and decreased lumbar lordosis  PALPATION: None to palpation.   LOWER EXTREMITY ROM:  Active ROM Right eval Left eval  Hip flexion [facility]  Hip abduction [facility]  Hip internal rotation [facility]  Hip external rotation [facility]  Knee flexion [facility]  Knee extension 95%  WFL  Ankle dorsiflexion [facility]  Ankle plantarflexion WFL WFL   (Blank rows = not tested)  LOWER  EXTREMITY MMT:  MMT Right eval Left eval  Hip flexion 5 5  Hip extension 5 5  Hip abduction 4 4  Knee flexion 5 5  Knee extension 5 5  Ankle dorsiflexion 4+ 4+  Ankle inversion 4+ 5  Ankle eversion 4+ 5   (Blank rows = not tested)  FUNCTIONAL TESTS:  5 times sit to stand: 24.64sec with reports of pain in bilat knees.  Timed up and go (TUG): 16.06 sec   GAIT: Distance walked: 56ft   Assistive device utilized: None Level of assistance:  Complete Independence Comments: Decreased trunk rotation, decreased arm swing, decreased stance length.   BERG BALANCE TEST Sitting to Standing: 4.      Stands without using hands and stabilize independently Standing Unsupported: 4.      Stands safely for 2 minutes Sitting Unsupported: 4.     Sits for 2 minutes independently Standing to Sitting: 4.     Sits safely with minimal use of hands Transfers: 4.     Transfers safely with minor use of hands Standing with eyes closed: 4.     Stands safely for 10 seconds  Standing with feet together: 4.     Stands for 1 minute safely Reaching forward with outstretched arm: 4.     Reaches forward 10 inches Retrieving object from the floor: 1.     Unable to pick up and needs supervision Turning to look behind: 3.     Looks behind one side only, other side less weight shift Turning 360 degrees: 3.     Able to turn on one side in </= 4 seconds Place alternate foot on stool: 3.     Completes 8 steps in >20 seconds Standing with one foot in front: 2.     Independent small step for 30 seconds Standing on one foot: 4.     Holds >10 seconds  Total Score: 50/56  TODAY'S TREATMENT:                                                                                                                              DATE: Creating, reviewing, and completing below HEP    PATIENT EDUCATION:  Education details: Educated pt on anatomy and physiology of current symptoms, FOTO, diagnosis, prognosis, HEP,  and POC. Person educated: Patient and Spouse Education method: Explanation Education comprehension: verbalized understanding  HOME EXERCISE PROGRAM: None today due to extensive Eval.    ASSESSMENT:  CLINICAL IMPRESSION: Patient referred to PT for balance and frequent falls. Pt also reports weakness in bilat ankles and ongoing R hip and bilat shoulder pain from most recent fall. He demonstrates an abnormal gait with decreased trunk rotation and arm swing. He is able to  perform steady balance, but has increased difficulty with dynamic balance. Pt with a long history of vertigo with no relief. Patient will benefit from skilled PT to address below impairments, limitations and improve overall function.  OBJECTIVE IMPAIRMENTS: decreased  activity tolerance, difficulty walking, decreased balance, decreased endurance, decreased mobility, decreased ROM, decreased strength, impaired flexibility, impaired UE/LE use, postural dysfunction, and pain.  ACTIVITY LIMITATIONS: bending, lifting, carry, locomotion, cleaning, community activity, driving, and or occupation  PERSONAL FACTORS: HTN, frequent falls are also affecting patient's functional outcome.  REHAB POTENTIAL: Good  CLINICAL DECISION MAKING: Stable/uncomplicated  EVALUATION COMPLEXITY: Moderate    GOALS: Short term PT Goals Target date: 09/13/2022  Pt will be I and compliant with HEP. Baseline:  Goal status: New  Long term PT goals Target date: 10/11/2022 Pt will improve ankle strength to at least 5-/5 MMT to improve functional strength Baseline: Goal status: New Pt will improve BERG by at least 3 points in order to demonstrate clinically significant improvement in balance.  Baseline: Goal status: New Pt will reduce pain by overall 50% overall with usual activity Baseline: Goal status: New Pt will improve TUG by 3.4 seconds or Minimal clinically important difference.  Baseline: Goal status: New Pt will improve her STS by 2.3 seconds for Minimal clinically important difference.  Baseline: Goal status: New Pt will be able to ambulate community distances at least 1000 ft WNL gait pattern without complaints Baseline: Goal status: New        7. Pt will report no dizziness with positional changes.  Baseline: Goal status: New        8. Pt will deny any new falls throughout duration of PT.  Baseline: Goal status: New   PLAN: PT FREQUENCY: 1-2 times per week   PT DURATION: 6 weeks  PLANNED  INTERVENTIONS (unless contraindicated): aquatic PT, Canalith repositioning, cryotherapy, Electrical stimulation, Iontophoresis with 4 mg/ml dexamethasome, Moist heat, traction, Ultrasound, gait training, Therapeutic exercise, balance training, neuromuscular re-education, patient/family education, prosthetic training, manual techniques, passive ROM, dry needling, taping, vasopnuematic device, vestibular, spinal manipulations, joint manipulations  PLAN FOR NEXT SESSION: Assess HEP/update PRN, continue to progress functional mobility, strengthen proximal hip muscles. Dynamic balance, head turns, cognitive tasks, vertigo.      Lynden Ang, PT 08/30/2022, 2:09 PM

## 2022-08-30 ENCOUNTER — Encounter: Payer: Self-pay | Admitting: Physical Therapy

## 2022-08-30 ENCOUNTER — Ambulatory Visit: Payer: Medicare HMO | Attending: Physician Assistant | Admitting: Physical Therapy

## 2022-08-30 ENCOUNTER — Other Ambulatory Visit: Payer: Self-pay

## 2022-08-30 DIAGNOSIS — R293 Abnormal posture: Secondary | ICD-10-CM | POA: Insufficient documentation

## 2022-08-30 DIAGNOSIS — M5459 Other low back pain: Secondary | ICD-10-CM | POA: Insufficient documentation

## 2022-08-30 DIAGNOSIS — R2689 Other abnormalities of gait and mobility: Secondary | ICD-10-CM | POA: Insufficient documentation

## 2022-08-30 DIAGNOSIS — M6281 Muscle weakness (generalized): Secondary | ICD-10-CM | POA: Diagnosis not present

## 2022-08-30 DIAGNOSIS — Z9181 History of falling: Secondary | ICD-10-CM | POA: Diagnosis not present

## 2022-09-06 ENCOUNTER — Ambulatory Visit: Payer: Medicare HMO | Attending: Physician Assistant | Admitting: Rehabilitative and Restorative Service Providers"

## 2022-09-06 ENCOUNTER — Encounter: Payer: Self-pay | Admitting: Rehabilitative and Restorative Service Providers"

## 2022-09-06 DIAGNOSIS — R293 Abnormal posture: Secondary | ICD-10-CM | POA: Diagnosis not present

## 2022-09-06 DIAGNOSIS — M5459 Other low back pain: Secondary | ICD-10-CM | POA: Diagnosis not present

## 2022-09-06 DIAGNOSIS — R2689 Other abnormalities of gait and mobility: Secondary | ICD-10-CM | POA: Insufficient documentation

## 2022-09-06 DIAGNOSIS — M6281 Muscle weakness (generalized): Secondary | ICD-10-CM | POA: Insufficient documentation

## 2022-09-06 NOTE — Therapy (Signed)
OUTPATIENT PHYSICAL THERAPY VESTIBULAR ASSESSMENT   Patient Name: Kent Watts MRN: [id number] DOB:1950/08/15, 72 y.o., male Today's Date: 09/06/2022  END OF SESSION:  PT End of Session - 09/06/22 1151     Visit Number 2    Date for PT Re-Evaluation 10/11/22    Authorization Type [employer] MEDICARE    Progress Note Due on Visit 10    PT Start Time 1149    PT Stop Time 1227    PT Time Calculation (min) 38 min    Activity Tolerance Patient tolerated treatment well    Behavior During Therapy WFL for tasks assessed/performed             Past Medical History:  Diagnosis Date   Hypertension    Past Surgical History:  Procedure Laterality Date   KNEE SURGERY Right    There are no problems to display for this patient.   REFERRING PROVIDER: Heywood Bene, PA-C   REFERRING DIAG: Repeated falls [R29.6], Personal history of fall [Z91.81], Other symptoms and signs involving the musculoskeletal system [R29.898], Other musculoskeletal symptoms referable to limbs(729.89) [R29.898]   THERAPY DIAG:  Other low back pain  Muscle weakness (generalized)  Other abnormalities of gait and mobility  Abnormal posture  Rationale for Evaluation and Treatment: Rehabilitation  ONSET DATE: Over a year ago is when he started falling   SUBJECTIVE:   SUBJECTIVE STATEMENT: Pt reports that he slipped on some soft/wet ground over the weekend when he was pushing his granddaughter on a swing.  Pt denies any injury after fall.  States that he has dizziness when he positions his head in certain positions.  Pt states that he has dizziness when he rolls over in bed.  Pt reports dizziness when looking overhead. Pt states that he has had intermittent dizziness for years, but it has gotten worse after cataract surgery a month ago  PERTINENT HISTORY: HTN, recent cataract surgery PAIN:  Are you having pain? Yes: NPRS scale: 2-3/10 Pain location: Bilat shoulders, R knee  Pain description: Achiness,   Aggravating factors: Decreased activity,  Relieving factors: CBD oil  PRECAUTIONS: Fall  WEIGHT BEARING RESTRICTIONS: No  FALLS:  Has patient fallen in last 6 months? Yes. Number of falls 2  LIVING ENVIRONMENT: Lives with: lives with their spouse Lives in: House/apartment Stairs: Yes: External: 3 steps; none Has following equipment at home: Single point cane, Walker - 4 wheeled, and shower chair  OCCUPATION: Retired.   PLOF: Independent  PATIENT GOALS: Improve balance, strength and activity tolerance.   OBJECTIVE:   DIAGNOSTIC FINDINGS: 1. No acute fracture. 2. 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 3. Nonobstructive right renal calculus.  COGNITION: Overall cognitive status: Within functional limits for tasks assessed     SENSATION: WFL  POSTURE: rounded shoulders, forward head, and decreased lumbar lordosis  PALPATION: None to palpation.   LOWER EXTREMITY ROM:  Active ROM Right eval Left eval  Hip flexion [facility]  Hip abduction [facility]  Hip internal rotation [facility]  Hip external rotation [facility]  Knee flexion [facility]  Knee extension 95%  WFL  Ankle dorsiflexion [facility] WFL  Ankle plantarflexion WFL WFL   (Blank rows = not tested)  LOWER EXTREMITY MMT:  MMT Right eval Left eval  Hip flexion 5 5  Hip extension 5 5  Hip abduction 4 4  Knee flexion 5 5  Knee extension 5 5  Ankle dorsiflexion 4+ 4+  Ankle inversion 4+ 5  Ankle eversion 4+ 5   (  Blank rows = not tested)  FUNCTIONAL TESTS:  Eval: 5 times sit to stand: 24.64sec with reports of pain in bilat knees.  Timed up and go (TUG): 16.06 sec   GAIT: Distance walked: 64ft   Assistive device utilized: None Level of assistance: Complete Independence Comments: Decreased trunk rotation, decreased arm swing, decreased stance length.   BERG BALANCE TEST Eval: Sitting to Standing: 4.      Stands without using hands and stabilize independently Standing Unsupported: 4.      Stands safely  for 2 minutes Sitting Unsupported: 4.     Sits for 2 minutes independently Standing to Sitting: 4.     Sits safely with minimal use of hands Transfers: 4.     Transfers safely with minor use of hands Standing with eyes closed: 4.     Stands safely for 10 seconds  Standing with feet together: 4.     Stands for 1 minute safely Reaching forward with outstretched arm: 4.     Reaches forward 10 inches Retrieving object from the floor: 1.     Unable to pick up and needs supervision Turning to look behind: 3.     Looks behind one side only, other side less weight shift Turning 360 degrees: 3.     Able to turn on one side in </= 4 seconds Place alternate foot on stool: 3.     Completes 8 steps in >20 seconds Standing with one foot in front: 2.     Independent small step for 30 seconds Standing on one foot: 4.     Holds >10 seconds  Total Score: 48/56  VESTIBULAR ASSESSMENT: 09/06/2022: Some decreased speed/slowness with tracking, especially towards left side Saccades are WFL Gaze stabilization is WFL Difficult to assess head thrust as pt had difficulty relaxing (but pt does report dizziness with fast head motions at times) Marye Round positive on each side with symptoms approx 10 sec   TODAY'S TREATMENT:                                                                                                                              DATE: 09/06/2022 Nustep level 2 x6 min with PT present to discuss status  Canalith repositioning with Epley on right side x1 with symptoms improving Canalith repositioning with Epley on left side x2 with less symptoms noted on second trial Issued HEP and reviewed with patient Education on BPPV and vestibular rehab   PATIENT EDUCATION:  Education details: Issued HEP Person educated: Patient Education method: Explanation Education comprehension: verbalized understanding  HOME EXERCISE PROGRAM: Access Code: [email or web address] URL: [email or web address] Date:  09/06/2022 Prepared by: Shelby Dubin Telia Amundson  Exercises - Pencil Pushups  - 1 x daily - 7 x weekly - 2 sets - 10 reps - Seated Horizontal Smooth Pursuit  - 1 x daily - 7 x weekly - 1 sets - 26 reps - Seated Gaze Stabilization with Head Rotation  - 1 x daily -  7 x weekly - 1 sets - 20 reps - Seated Gaze Stabilization with Head Nod  - 1 x daily - 7 x weekly - 1 sets - 20 reps - Brandt-Daroff Vestibular Exercise  - 1 x daily - 7 x weekly - 1 sets - 3 reps   ASSESSMENT:  CLINICAL IMPRESSION:  Mr. Claeys presents to skilled PT reporting continued dizziness, especially with positional changes.  Patient with reports of recent cataract surgery and states that his dizziness has been worse since that time.  Did note some difficulty with tracking, especially to the left.  Provided patient with smooth pursuit exercises to assist with oculomotor strengthening.  Patient with positive Marye Round on bilateral sides and proceeded with treatment with canalith repositioning with the Epley Maneuver.  Following canalith repositioning, patient was able to look up with less reports of dizziness.  Patient issued HEP to assist with vestibular rehab.  OBJECTIVE IMPAIRMENTS: decreased activity tolerance, difficulty walking, decreased balance, decreased endurance, decreased mobility, decreased ROM, decreased strength, impaired flexibility, impaired UE/LE use, postural dysfunction, and pain.  ACTIVITY LIMITATIONS: bending, lifting, carry, locomotion, cleaning, community activity, driving, and or occupation  PERSONAL FACTORS: HTN, frequent falls are also affecting patient's functional outcome.  REHAB POTENTIAL: Good  CLINICAL DECISION MAKING: Stable/uncomplicated  EVALUATION COMPLEXITY: Moderate    GOALS: Short term PT Goals Target date: 09/13/2022  Pt will be I and compliant with HEP. Baseline:  Goal status: IN PROGRESS  Long term PT goals Target date: 10/11/2022 Pt will improve ankle strength to at least 5-/5 MMT to  improve functional strength Baseline: Goal status: New  Pt will improve BERG by at least 3 points in order to demonstrate clinically significant improvement in balance.  Baseline:  48/56 Goal status: New  Pt will reduce pain by overall 50% overall with usual activity Baseline: Goal status: New  Pt will improve TUG by 3.4 seconds or Minimal clinically important difference.  Baseline:  16.06 sec Goal status: New  Pt will improve her STS by 2.3 seconds for Minimal clinically important difference.  Baseline:  24.64 sec Goal status: New  Pt will be able to ambulate community distances at least 1000 ft WNL gait pattern without complaints Baseline: Goal status: New         7. Pt will report no dizziness with positional changes.  Baseline: Goal status: New         8. Pt will deny any new falls throughout duration of PT.  Baseline: Goal status: New   PLAN: PT FREQUENCY: 1-2 times per week   PT DURATION: 6 weeks  PLANNED INTERVENTIONS (unless contraindicated): aquatic PT, Canalith repositioning, cryotherapy, Electrical stimulation, Iontophoresis with 4 mg/ml dexamethasome, Moist heat, traction, Ultrasound, gait training, Therapeutic exercise, balance training, neuromuscular re-education, patient/family education, prosthetic training, manual techniques, passive ROM, dry needling, taping, vasopnuematic device, vestibular, spinal manipulations, joint manipulations  PLAN FOR NEXT SESSION: Assess HEP/update PRN, vestibular/balance, strengthening, canalith repositioning as needed      Juel Burrow, PT 09/06/2022, 1:34 PM  [facility] [address] [place], [place] [address] Phone # [phone] Fax [phone]

## 2022-09-13 ENCOUNTER — Ambulatory Visit: Payer: Medicare HMO | Admitting: Rehabilitative and Restorative Service Providers"

## 2022-09-13 ENCOUNTER — Encounter: Payer: Self-pay | Admitting: Rehabilitative and Restorative Service Providers"

## 2022-09-13 DIAGNOSIS — R2689 Other abnormalities of gait and mobility: Secondary | ICD-10-CM

## 2022-09-13 DIAGNOSIS — M5459 Other low back pain: Secondary | ICD-10-CM | POA: Diagnosis not present

## 2022-09-13 DIAGNOSIS — R293 Abnormal posture: Secondary | ICD-10-CM | POA: Diagnosis not present

## 2022-09-13 DIAGNOSIS — M6281 Muscle weakness (generalized): Secondary | ICD-10-CM | POA: Diagnosis not present

## 2022-09-13 NOTE — Therapy (Signed)
OUTPATIENT PHYSICAL THERAPY VESTIBULAR TREATMENT NOTE   Patient Name: Kent Watts MRN: [id number] DOB:05-05-1951, 72 y.o., male Today's Date: 09/13/2022  END OF SESSION:  PT End of Session - 09/13/22 1016     Visit Number 3    Date for PT Re-Evaluation 10/11/22    Authorization Type [employer] MEDICARE    Progress Note Due on Visit 10    PT Start Time 1014    PT Stop Time 1055    PT Time Calculation (min) 41 min    Activity Tolerance Patient tolerated treatment well    Behavior During Therapy WFL for tasks assessed/performed             Past Medical History:  Diagnosis Date   Hypertension    Past Surgical History:  Procedure Laterality Date   KNEE SURGERY Right    There are no problems to display for this patient.   REFERRING PROVIDER: Roger Kill, PA-C   REFERRING DIAG: Repeated falls [R29.6], Personal history of fall [Z91.81], Other symptoms and signs involving the musculoskeletal system [R29.898], Other musculoskeletal symptoms referable to limbs(729.89) [R29.898]   THERAPY DIAG:  Other low back pain  Muscle weakness (generalized)  Other abnormalities of gait and mobility  Abnormal posture  Rationale for Evaluation and Treatment: Rehabilitation  ONSET DATE: Over a year ago is when he started falling   SUBJECTIVE:   SUBJECTIVE STATEMENT: Pt reports that he is still having some dizziness, but does state that it is some better since last visit.  PERTINENT HISTORY: HTN, recent cataract surgery PAIN:  Are you having pain? Yes: NPRS scale: 3/10 Pain location: Bilat shoulders, R knee  Pain description: Achiness,  Aggravating factors: Decreased activity,  Relieving factors: CBD oil  PRECAUTIONS: Fall  WEIGHT BEARING RESTRICTIONS: No  FALLS:  Has patient fallen in last 6 months? Yes. Number of falls 2  LIVING ENVIRONMENT: Lives with: lives with their spouse Lives in: House/apartment Stairs: Yes: External: 3 steps; none Has following  equipment at home: Single point cane, Walker - 4 wheeled, and shower chair  OCCUPATION: Retired.   PLOF: Independent  PATIENT GOALS: Improve balance, strength and activity tolerance.   OBJECTIVE:   DIAGNOSTIC FINDINGS: 1. No acute fracture. 2. 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 3. Nonobstructive right renal calculus.  COGNITION: Overall cognitive status: Within functional limits for tasks assessed     SENSATION: WFL  POSTURE: rounded shoulders, forward head, and decreased lumbar lordosis  PALPATION: None to palpation.   LOWER EXTREMITY ROM:  Active ROM Right eval Left eval  Hip flexion [facility]  Hip abduction [facility]  Hip internal rotation [facility]  Hip external rotation [facility]  Knee flexion [facility] WFL  Knee extension 95%  WFL  Ankle dorsiflexion [facility] WFL  Ankle plantarflexion WFL WFL   (Blank rows = not tested)  LOWER EXTREMITY MMT:  MMT Right eval Left eval  Hip flexion 5 5  Hip extension 5 5  Hip abduction 4 4  Knee flexion 5 5  Knee extension 5 5  Ankle dorsiflexion 4+ 4+  Ankle inversion 4+ 5  Ankle eversion 4+ 5   (Blank rows = not tested)  FUNCTIONAL TESTS:  Eval: 5 times sit to stand: 24.64sec with reports of pain in bilat knees.  Timed up and go (TUG): 16.06 sec   GAIT: Distance walked: 47ft   Assistive device utilized: None Level of assistance: Complete Independence Comments: Decreased trunk rotation, decreased arm swing, decreased stance length.   BERG  BALANCE TEST Eval: Sitting to Standing: 4.      Stands without using hands and stabilize independently Standing Unsupported: 4.      Stands safely for 2 minutes Sitting Unsupported: 4.     Sits for 2 minutes independently Standing to Sitting: 4.     Sits safely with minimal use of hands Transfers: 4.     Transfers safely with minor use of hands Standing with eyes closed: 4.     Stands safely for 10 seconds  Standing with feet together: 4.     Stands for 1 minute  safely Reaching forward with outstretched arm: 4.     Reaches forward 10 inches Retrieving object from the floor: 1.     Unable to pick up and needs supervision Turning to look behind: 3.     Looks behind one side only, other side less weight shift Turning 360 degrees: 3.     Able to turn on one side in </= 4 seconds Place alternate foot on stool: 3.     Completes 8 steps in >20 seconds Standing with one foot in front: 2.     Independent small step for 30 seconds Standing on one foot: 4.     Holds >10 seconds  Total Score: 48/56  VESTIBULAR ASSESSMENT: 09/06/2022: Some decreased speed/slowness with tracking, especially towards left side Saccades are WFL Gaze stabilization is WFL Difficult to assess head thrust as pt had difficulty relaxing (but pt does report dizziness with fast head motions at times) Gilberto Betterix Hallpike positive on each side with symptoms approx 10 sec   TODAY'S TREATMENT:                                                                                                                               DATE: 09/13/2022 Nustep level 4 x6 min with PT present to discuss status  Alt LE toe tap to 6" step 2x10 bilat Standing rocking on rocker board x1 min at barre Sit to/from stand on purple mat x15 Standing on purple foam performing ball toss x15 Standing on purple foam with eyes closed 2 x 10 sec with occasional external UE support from PT [employer] Hallpike with slight positive to right side, proceeded with Epley Maneuver x1 Tandem gait on AirEx beam with UE support as needed down and back x3 laps Side stepping on AirEx beam without UE support x3 lap bilat FWD step up on bosu x10 bilat Side step up and over bosu and back x10 Marching on trampoline 2x1 min without UE support   DATE: 09/06/2022 Nustep level 2 x6 min with PT present to discuss status  Canalith repositioning with Epley on right side x1 with symptoms improving Canalith repositioning with Epley on left side x2 with less  symptoms noted on second trial Issued HEP and reviewed with patient Education on BPPV and vestibular rehab   PATIENT EDUCATION:  Education details: Issued HEP Person educated: Patient Education method: Hospital doctorxplanation Education  comprehension: verbalized understanding  HOME EXERCISE PROGRAM: Access Code: [email or web address] URL: [email or web address] Date: 09/06/2022 Prepared by: Clydie Braun Lashaya Kienitz  Exercises - Pencil Pushups  - 1 x daily - 7 x weekly - 2 sets - 10 reps - Seated Horizontal Smooth Pursuit  - 1 x daily - 7 x weekly - 1 sets - 26 reps - Seated Gaze Stabilization with Head Rotation  - 1 x daily - 7 x weekly - 1 sets - 20 reps - Seated Gaze Stabilization with Head Nod  - 1 x daily - 7 x weekly - 1 sets - 20 reps - Brandt-Daroff Vestibular Exercise  - 1 x daily - 7 x weekly - 1 sets - 3 reps   ASSESSMENT:  CLINICAL IMPRESSION:  Mr. Pitzen presents to skilled PT reporting some overall decrease in dizziness, but he did report having one "bad day".  Patient did not report any new falls.  Patient with decreased nystagmus noted with [name] during session today and states that overall, he is having less dizziness when he lies down at night to sleep.  Patient continues to progress with balance during session.  OBJECTIVE IMPAIRMENTS: decreased activity tolerance, difficulty walking, decreased balance, decreased endurance, decreased mobility, decreased ROM, decreased strength, impaired flexibility, impaired UE/LE use, postural dysfunction, and pain.  ACTIVITY LIMITATIONS: bending, lifting, carry, locomotion, cleaning, community activity, driving, and or occupation  PERSONAL FACTORS: HTN, frequent falls are also affecting patient's functional outcome.  REHAB POTENTIAL: Good  CLINICAL DECISION MAKING: Stable/uncomplicated  EVALUATION COMPLEXITY: Moderate    GOALS: Short term PT Goals Target date: 09/13/2022  Pt will be I and compliant with HEP. Baseline:  Goal status:  MET  Long term PT goals Target date: 10/11/2022 Pt will improve ankle strength to at least 5-/5 MMT to improve functional strength Baseline: Goal status: New  Pt will improve BERG by at least 3 points in order to demonstrate clinically significant improvement in balance.  Baseline:  48/56 Goal status: IN PROGRESS  Pt will reduce pain by overall 50% overall with usual activity Baseline: Goal status: New  Pt will improve TUG by 3.4 seconds or Minimal clinically important difference.  Baseline:  16.06 sec Goal status: New  Pt will improve her STS by 2.3 seconds for Minimal clinically important difference.  Baseline:  24.64 sec Goal status: New  Pt will be able to ambulate community distances at least 1000 ft WNL gait pattern without complaints Baseline: Goal status: New         7. Pt will report no dizziness with positional changes.  Baseline: Goal status: New         8. Pt will deny any new falls throughout duration of PT.  Baseline: Goal status: New   PLAN: PT FREQUENCY: 1-2 times per week   PT DURATION: 6 weeks  PLANNED INTERVENTIONS (unless contraindicated): aquatic PT, Canalith repositioning, cryotherapy, Electrical stimulation, Iontophoresis with 4 mg/ml dexamethasome, Moist heat, traction, Ultrasound, gait training, Therapeutic exercise, balance training, neuromuscular re-education, patient/family education, prosthetic training, manual techniques, passive ROM, dry needling, taping, vasopnuematic device, vestibular, spinal manipulations, joint manipulations  PLAN FOR NEXT SESSION: Assess HEP/update PRN, vestibular/balance, strengthening, canalith repositioning as needed      Reather Laurence, PT 09/13/2022, 10:59 AM  [facility] [address], [address] [place], [place] [address] Phone # [phone] Fax [phone]

## 2022-09-15 ENCOUNTER — Ambulatory Visit: Payer: Medicare HMO | Admitting: Rehabilitative and Restorative Service Providers"

## 2022-09-15 ENCOUNTER — Encounter: Payer: Self-pay | Admitting: Rehabilitative and Restorative Service Providers"

## 2022-09-15 DIAGNOSIS — R293 Abnormal posture: Secondary | ICD-10-CM | POA: Diagnosis not present

## 2022-09-15 DIAGNOSIS — M6281 Muscle weakness (generalized): Secondary | ICD-10-CM

## 2022-09-15 DIAGNOSIS — R2689 Other abnormalities of gait and mobility: Secondary | ICD-10-CM | POA: Diagnosis not present

## 2022-09-15 DIAGNOSIS — M5459 Other low back pain: Secondary | ICD-10-CM | POA: Diagnosis not present

## 2022-09-15 NOTE — Therapy (Signed)
OUTPATIENT PHYSICAL THERAPY VESTIBULAR TREATMENT NOTE   Patient Name: Kent Watts MRN: [id number] DOB:Dec 20, 1950, 72 y.o., male Today's Date: 09/15/2022  END OF SESSION:  PT End of Session - 09/15/22 1023     Visit Number 4    Date for PT Re-Evaluation 10/11/22    Authorization Type [employer] MEDICARE    Progress Note Due on Visit 10    PT Start Time 1020    PT Stop Time 1100    PT Time Calculation (min) 40 min    Activity Tolerance Patient tolerated treatment well    Behavior During Therapy WFL for tasks assessed/performed             Past Medical History:  Diagnosis Date   Hypertension    Past Surgical History:  Procedure Laterality Date   KNEE SURGERY Right    There are no problems to display for this patient.   REFERRING PROVIDER: Roger Kill, PA-C   REFERRING DIAG: Repeated falls [R29.6], Personal history of fall [Z91.81], Other symptoms and signs involving the musculoskeletal system [R29.898], Other musculoskeletal symptoms referable to limbs(729.89) [R29.898]   THERAPY DIAG:  Other low back pain  Muscle weakness (generalized)  Other abnormalities of gait and mobility  Abnormal posture  Rationale for Evaluation and Treatment: Rehabilitation  ONSET DATE: Over a year ago is when he started falling   SUBJECTIVE:   SUBJECTIVE STATEMENT: Pt reports that he is having some increased pain today in his back, but states that he has been doing a lot since last PT visit.  PERTINENT HISTORY: HTN, recent cataract surgery PAIN:  Are you having pain? Yes: NPRS scale: 8/10 Pain location: left lumbar  Pain description: Achiness,  Aggravating factors: Decreased activity,  Relieving factors: CBD oil  PRECAUTIONS: Fall  WEIGHT BEARING RESTRICTIONS: No  FALLS:  Has patient fallen in last 6 months? Yes. Number of falls 2  LIVING ENVIRONMENT: Lives with: lives with their spouse Lives in: House/apartment Stairs: Yes: External: 3 steps; none Has  following equipment at home: Single point cane, Walker - 4 wheeled, and shower chair  OCCUPATION: Retired.   PLOF: Independent  PATIENT GOALS: Improve balance, strength and activity tolerance.   OBJECTIVE:   DIAGNOSTIC FINDINGS: 1. No acute fracture. 2. 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 3. Nonobstructive right renal calculus.  COGNITION: Overall cognitive status: Within functional limits for tasks assessed     SENSATION: WFL  POSTURE: rounded shoulders, forward head, and decreased lumbar lordosis  PALPATION: None to palpation.   LOWER EXTREMITY ROM:  Active ROM Right eval Left eval  Hip flexion [facility]  Hip abduction [facility]  Hip internal rotation [facility]  Hip external rotation [facility]  Knee flexion [facility] WFL  Knee extension 95%  WFL  Ankle dorsiflexion [facility] WFL  Ankle plantarflexion WFL WFL   (Blank rows = not tested)  LOWER EXTREMITY MMT:  MMT Right eval Left eval  Hip flexion 5 5  Hip extension 5 5  Hip abduction 4 4  Knee flexion 5 5  Knee extension 5 5  Ankle dorsiflexion 4+ 4+  Ankle inversion 4+ 5  Ankle eversion 4+ 5   (Blank rows = not tested)  FUNCTIONAL TESTS:  Eval: 5 times sit to stand: 24.64sec with reports of pain in bilat knees.  Timed up and go (TUG): 16.06 sec   09/15/2022: 3 minute walk:  577 ft  GAIT: Distance walked: 65ft   Assistive device utilized: None Level of assistance: Complete Independence Comments:  Decreased trunk rotation, decreased arm swing, decreased stance length.   BERG BALANCE TEST Eval: Sitting to Standing: 4.      Stands without using hands and stabilize independently Standing Unsupported: 4.      Stands safely for 2 minutes Sitting Unsupported: 4.     Sits for 2 minutes independently Standing to Sitting: 4.     Sits safely with minimal use of hands Transfers: 4.     Transfers safely with minor use of hands Standing with eyes closed: 4.     Stands safely for 10 seconds  Standing with feet  together: 4.     Stands for 1 minute safely Reaching forward with outstretched arm: 4.     Reaches forward 10 inches Retrieving object from the floor: 1.     Unable to pick up and needs supervision Turning to look behind: 3.     Looks behind one side only, other side less weight shift Turning 360 degrees: 3.     Able to turn on one side in </= 4 seconds Place alternate foot on stool: 3.     Completes 8 steps in >20 seconds Standing with one foot in front: 2.     Independent small step for 30 seconds Standing on one foot: 4.     Holds >10 seconds  Total Score: 48/56  VESTIBULAR ASSESSMENT: 09/06/2022: Some decreased speed/slowness with tracking, especially towards left side Saccades are WFL Gaze stabilization is WFL Difficult to assess head thrust as pt had difficulty relaxing (but pt does report dizziness with fast head motions at times) [name] positive on each side with symptoms approx 10 sec    TODAY'S TREATMENT:                                                                                                                               DATE: 09/15/2022 Nustep level 4 x6 min with PT present to discuss status  3 minutes ambulation around PT gym Standing on purple foam with eyes closed 3x10 sec with close SBA/CGA Standing on purple foam performing head rotation and head nods x1 min each with SBA/CGA Seated hamstring stretch 2x20 sec bilat Seated blue pball rollout 3x10 sec Tandem gait on AirEx beam in parallel bars x3 laps down and back without UE support Side stepping on AirEx beam in parallel bars x4 laps each direction without UE support Manual Therapy:  Pt in prone using Addaday to left side lumbar paraspinals, glutes/piriformis, hamstring   DATE: 09/13/2022 Nustep level 4 x6 min with PT present to discuss status  Alt LE toe tap to 6" step 2x10 bilat Standing rocking on rocker board x1 min at barre Sit to/from stand on purple mat x15 Standing on purple foam performing  ball toss x15 Standing on purple foam with eyes closed 2 x 10 sec with occasional external UE support from PT [employer] with slight positive to right side, proceeded with Epley Maneuver x1  Tandem gait on AirEx beam with UE support as needed down and back x3 laps Side stepping on AirEx beam without UE support x3 lap bilat FWD step up on bosu x10 bilat Side step up and over bosu and back x10 Marching on trampoline 2x1 min without UE support   DATE: 09/06/2022 Nustep level 2 x6 min with PT present to discuss status  Canalith repositioning with Epley on right side x1 with symptoms improving Canalith repositioning with Epley on left side x2 with less symptoms noted on second trial Issued HEP and reviewed with patient Education on BPPV and vestibular rehab   PATIENT EDUCATION:  Education details: Issued HEP Person educated: Patient Education method: Explanation Education comprehension: verbalized understanding  HOME EXERCISE PROGRAM: Access Code: [email or web address] URL: [email or web address] Date: 09/06/2022 Prepared by: Clydie BraunShaunda Makella Buckingham  Exercises - Pencil Pushups  - 1 x daily - 7 x weekly - 2 sets - 10 reps - Seated Horizontal Smooth Pursuit  - 1 x daily - 7 x weekly - 1 sets - 26 reps - Seated Gaze Stabilization with Head Rotation  - 1 x daily - 7 x weekly - 1 sets - 20 reps - Seated Gaze Stabilization with Head Nod  - 1 x daily - 7 x weekly - 1 sets - 20 reps - Brandt-Daroff Vestibular Exercise  - 1 x daily - 7 x weekly - 1 sets - 3 reps   ASSESSMENT:  CLINICAL IMPRESSION:  Mr. Iona HansenCarey presents to skilled PT reporting increased left sided back pain today.  Patient reports compliance with HEP and denies any new falls.  Patient able to perform some balance exercises during PT session.  Added addaday manual therapy secondary to reports of increased reports of pain.  Patient reports decreased pain following manual therapy.  Able to obtain a 3 minute walk test to obtain a baseline  measurement for distance during session today.  OBJECTIVE IMPAIRMENTS: decreased activity tolerance, difficulty walking, decreased balance, decreased endurance, decreased mobility, decreased ROM, decreased strength, impaired flexibility, impaired UE/LE use, postural dysfunction, and pain.  ACTIVITY LIMITATIONS: bending, lifting, carry, locomotion, cleaning, community activity, driving, and or occupation  PERSONAL FACTORS: HTN, frequent falls are also affecting patient's functional outcome.  REHAB POTENTIAL: Good  CLINICAL DECISION MAKING: Stable/uncomplicated  EVALUATION COMPLEXITY: Moderate    GOALS: Short term PT Goals Target date: 09/13/2022  Pt will be I and compliant with HEP. Baseline:  Goal status: MET  Long term PT goals Target date: 10/11/2022 Pt will improve ankle strength to at least 5-/5 MMT to improve functional strength Baseline: Goal status: New  Pt will improve BERG by at least 3 points in order to demonstrate clinically significant improvement in balance.  Baseline:  48/56 Goal status: IN PROGRESS  Pt will reduce pain by overall 50% overall with usual activity Baseline: Goal status: New  Pt will improve TUG by 3.4 seconds or Minimal clinically important difference.  Baseline:  16.06 sec Goal status: New  Pt will improve her STS by 2.3 seconds for Minimal clinically important difference.  Baseline:  24.64 sec Goal status: New  Pt will be able to ambulate community distances at least 1000 ft WNL gait pattern without complaints Baseline: Goal status: ONGOING         7. Pt will report no dizziness with positional changes.  Baseline: Goal status: ONGOING         8. Pt will deny any new falls throughout duration of PT.  Baseline: Goal status: ONGOING   PLAN: PT FREQUENCY:  1-2 times per week   PT DURATION: 6 weeks  PLANNED INTERVENTIONS (unless contraindicated): aquatic PT, Canalith repositioning, cryotherapy, Electrical stimulation, Iontophoresis  with 4 mg/ml dexamethasome, Moist heat, traction, Ultrasound, gait training, Therapeutic exercise, balance training, neuromuscular re-education, patient/family education, prosthetic training, manual techniques, passive ROM, dry needling, taping, vasopnuematic device, vestibular, spinal manipulations, joint manipulations  PLAN FOR NEXT SESSION: Assess HEP/update PRN, vestibular/balance, strengthening, canalith repositioning as needed      Reather Laurence, PT 09/15/2022, 11:20 AM  [facility] [address], [address] [place], [place] [address] Phone # [phone] Fax [phone]

## 2022-09-21 ENCOUNTER — Encounter: Payer: Self-pay | Admitting: Rehabilitative and Restorative Service Providers"

## 2022-09-21 ENCOUNTER — Ambulatory Visit: Payer: Medicare HMO | Admitting: Rehabilitative and Restorative Service Providers"

## 2022-09-21 DIAGNOSIS — R293 Abnormal posture: Secondary | ICD-10-CM | POA: Diagnosis not present

## 2022-09-21 DIAGNOSIS — M6281 Muscle weakness (generalized): Secondary | ICD-10-CM | POA: Diagnosis not present

## 2022-09-21 DIAGNOSIS — R2689 Other abnormalities of gait and mobility: Secondary | ICD-10-CM | POA: Diagnosis not present

## 2022-09-21 DIAGNOSIS — M5459 Other low back pain: Secondary | ICD-10-CM | POA: Diagnosis not present

## 2022-09-21 NOTE — Therapy (Signed)
OUTPATIENT PHYSICAL THERAPY VESTIBULAR TREATMENT NOTE   Patient Name: Kent Watts MRN: [id number] DOB:02-Dec-1950, 72 y.o., male Today's Date: 09/21/2022  END OF SESSION:  PT End of Session - 09/21/22 1107     Visit Number 5    Date for PT Re-Evaluation 10/11/22    Authorization Type [employer] MEDICARE    Progress Note Due on Visit 10    PT Start Time 1104    PT Stop Time 1143    PT Time Calculation (min) 39 min    Activity Tolerance Patient tolerated treatment well    Behavior During Therapy WFL for tasks assessed/performed             Past Medical History:  Diagnosis Date   Hypertension    Past Surgical History:  Procedure Laterality Date   KNEE SURGERY Right    There are no problems to display for this patient.   REFERRING PROVIDER: Roger Kill, PA-C   REFERRING DIAG: Repeated falls [R29.6], Personal history of fall [Z91.81], Other symptoms and signs involving the musculoskeletal system [R29.898], Other musculoskeletal symptoms referable to limbs(729.89) [R29.898]   THERAPY DIAG:  Other low back pain  Muscle weakness (generalized)  Other abnormalities of gait and mobility  Abnormal posture  Rationale for Evaluation and Treatment: Rehabilitation  ONSET DATE: Over a year ago is when he started falling   SUBJECTIVE:   SUBJECTIVE STATEMENT: Pt reports that his back pain has improved significantly since last visit and denies current pain.  States that he went and bought a massage gun to assist.  Patient reports that he fell down again when he was outside with his granddaughter when he turned around and accidentally backed into a [employer] and tripped.  PERTINENT HISTORY: HTN, recent cataract surgery PAIN:  Are you having pain? Yes: NPRS scale: 0-2/10 Pain location: left lumbar  Pain description: Achiness,  Aggravating factors: Decreased activity,  Relieving factors: CBD oil  PRECAUTIONS: Fall  WEIGHT BEARING RESTRICTIONS: No  FALLS:   Has patient fallen in last 6 months? Yes. Number of falls 2  LIVING ENVIRONMENT: Lives with: lives with their spouse Lives in: House/apartment Stairs: Yes: External: 3 steps; none Has following equipment at home: Single point cane, Walker - 4 wheeled, and shower chair  OCCUPATION: Retired.   PLOF: Independent  PATIENT GOALS: Improve balance, strength and activity tolerance.   OBJECTIVE:   DIAGNOSTIC FINDINGS: 1. No acute fracture. 2. 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 3. Nonobstructive right renal calculus.  COGNITION: Overall cognitive status: Within functional limits for tasks assessed     SENSATION: WFL  POSTURE: rounded shoulders, forward head, and decreased lumbar lordosis  PALPATION: None to palpation.   LOWER EXTREMITY ROM:  Active ROM Right eval Left eval  Hip flexion [facility]  Hip abduction [facility]  Hip internal rotation [facility]  Hip external rotation [facility]  Knee flexion [facility] WFL  Knee extension 95%  WFL  Ankle dorsiflexion [facility] WFL  Ankle plantarflexion WFL WFL   (Blank rows = not tested)  LOWER EXTREMITY MMT:  MMT Right eval Left eval  Hip flexion 5 5  Hip extension 5 5  Hip abduction 4 4  Knee flexion 5 5  Knee extension 5 5  Ankle dorsiflexion 4+ 4+  Ankle inversion 4+ 5  Ankle eversion 4+ 5   (Blank rows = not tested)  FUNCTIONAL TESTS:  Eval: 5 times sit to stand: 24.64sec with reports of pain in bilat knees.  Timed up and  go (TUG): 16.06 sec   09/15/2022: 3 minute walk:  577 ft  GAIT: Distance walked: 34ft   Assistive device utilized: None Level of assistance: Complete Independence Comments: Decreased trunk rotation, decreased arm swing, decreased stance length.   BERG BALANCE TEST Eval: Sitting to Standing: 4.      Stands without using hands and stabilize independently Standing Unsupported: 4.      Stands safely for 2 minutes Sitting Unsupported: 4.     Sits for 2 minutes independently Standing to Sitting: 4.      Sits safely with minimal use of hands Transfers: 4.     Transfers safely with minor use of hands Standing with eyes closed: 4.     Stands safely for 10 seconds  Standing with feet together: 4.     Stands for 1 minute safely Reaching forward with outstretched arm: 4.     Reaches forward 10 inches Retrieving object from the floor: 1.     Unable to pick up and needs supervision Turning to look behind: 3.     Looks behind one side only, other side less weight shift Turning 360 degrees: 3.     Able to turn on one side in </= 4 seconds Place alternate foot on stool: 3.     Completes 8 steps in >20 seconds Standing with one foot in front: 2.     Independent small step for 30 seconds Standing on one foot: 4.     Holds >10 seconds  Total Score: 48/56  VESTIBULAR ASSESSMENT: 09/06/2022: Some decreased speed/slowness with tracking, especially towards left side Saccades are WFL Gaze stabilization is WFL Difficult to assess head thrust as pt had difficulty relaxing (but pt does report dizziness with fast head motions at times) [name] positive on each side with symptoms approx 10 sec    TODAY'S TREATMENT:                                                                                                                               DATE: 09/21/2022 Nustep level 4 x6 min with PT present to discuss status Canalith repositioning with Epley on right side x1 with symptoms improving Canalith repositioning with Epley on left side x2 with less symptoms noted on second trial Tandem gait at barre x4 laps down and back Single leg stance at barre with UE support as needed 2x20 sec bilat Rocker board x2 min Sit to stand x10 Alt LE toe tap to 6" step 2x10 bilat   DATE: 09/15/2022 Nustep level 4 x6 min with PT present to discuss status  3 minutes ambulation around PT gym Standing on purple foam with eyes closed 3x10 sec with close SBA/CGA Standing on purple foam performing head rotation and head nods  x1 min each with SBA/CGA Seated hamstring stretch 2x20 sec bilat Seated blue pball rollout 3x10 sec Tandem gait on AirEx beam in parallel bars x3 laps down and back without UE support  Side stepping on AirEx beam in parallel bars x4 laps each direction without UE support Manual Therapy:  Pt in prone using Addaday to left side lumbar paraspinals, glutes/piriformis, hamstring   DATE: 09/13/2022 Nustep level 4 x6 min with PT present to discuss status  Alt LE toe tap to 6" step 2x10 bilat Standing rocking on rocker board x1 min at barre Sit to/from stand on purple mat x15 Standing on purple foam performing ball toss x15 Standing on purple foam with eyes closed 2 x 10 sec with occasional external UE support from PT [employer] with slight positive to right side, proceeded with Epley Maneuver x1 Tandem gait on AirEx beam with UE support as needed down and back x3 laps Side stepping on AirEx beam without UE support x3 lap bilat FWD step up on bosu x10 bilat Side step up and over bosu and back x10 Marching on trampoline 2x1 min without UE support    PATIENT EDUCATION:  Education details: Issued HEP Person educated: Patient Education method: Explanation Education comprehension: verbalized understanding  HOME EXERCISE PROGRAM: Access Code: [email or web address] URL: [email or web address] Date: 09/21/2022 Prepared by: Clydie Braun Kayloni Rocco  Exercises - Pencil Pushups  - 1 x daily - 7 x weekly - 2 sets - 10 reps - Seated Horizontal Smooth Pursuit  - 1 x daily - 7 x weekly - 1 sets - 26 reps - Seated Gaze Stabilization with Head Rotation  - 1 x daily - 7 x weekly - 1 sets - 20 reps - Seated Gaze Stabilization with Head Nod  - 1 x daily - 7 x weekly - 1 sets - 20 reps - Brandt-Daroff Vestibular Exercise  - 1 x daily - 7 x weekly - 1 sets - 3 reps - Tandem Walking with Counter Support  - 1 x daily - 7 x weekly - 2 sets - 10 reps - Single Leg Stance with Support  - 1 x daily - 7 x weekly - 1 sets -  2 reps - 20 sec hold - Sit to Stand with Arms Crossed  - 1 x daily - 7 x weekly - 2 sets - 10 reps  Patient Education - BPPV   ASSESSMENT:  CLINICAL IMPRESSION:  Mr. Commisso presents to skilled PT reporting decreased pain in his back today, but does state continued dizziness.  Patient reports approximately 10% improvement in dizziness since initial evaluation.  Able to perform Canalith Repositioning today, as patient primarily with symptoms on the left side.  On second trial with treating left side, noted less symptoms and decreased dizziness.  Patient with knee pain reported with sit to stand, so only performed one set.  Patient continues to progress with overall safety awareness and balance.  OBJECTIVE IMPAIRMENTS: decreased activity tolerance, difficulty walking, decreased balance, decreased endurance, decreased mobility, decreased ROM, decreased strength, impaired flexibility, impaired UE/LE use, postural dysfunction, and pain.  ACTIVITY LIMITATIONS: bending, lifting, carry, locomotion, cleaning, community activity, driving, and or occupation  PERSONAL FACTORS: HTN, frequent falls are also affecting patient's functional outcome.  REHAB POTENTIAL: Good  CLINICAL DECISION MAKING: Stable/uncomplicated  EVALUATION COMPLEXITY: Moderate    GOALS: Short term PT Goals Target date: 09/13/2022  Pt will be I and compliant with HEP. Baseline:  Goal status: MET  Long term PT goals Target date: 10/11/2022 Pt will improve ankle strength to at least 5-/5 MMT to improve functional strength Baseline: Goal status: New  Pt will improve BERG by at least 3 points in order to demonstrate clinically significant improvement in balance.  Baseline:  48/56 Goal status: IN PROGRESS  Pt will reduce pain by overall 50% overall with usual activity Baseline: Goal status: New  Pt will improve TUG by 3.4 seconds or Minimal clinically important difference.  Baseline:  16.06 sec Goal status: New  Pt will  improve his STS by 2.3 seconds for Minimal clinically important difference.  Baseline:  24.64 sec Goal status: New  Pt will be able to ambulate community distances at least 1000 ft WNL gait pattern without complaints Baseline: Goal status: ONGOING         7. Pt will report no dizziness with positional changes.  Baseline: Goal status: ONGOING         8. Pt will deny any new falls throughout duration of PT.  Baseline: Goal status: ONGOING   PLAN: PT FREQUENCY: 1-2 times per week   PT DURATION: 6 weeks  PLANNED INTERVENTIONS (unless contraindicated): aquatic PT, Canalith repositioning, cryotherapy, Electrical stimulation, Iontophoresis with 4 mg/ml dexamethasome, Moist heat, traction, Ultrasound, gait training, Therapeutic exercise, balance training, neuromuscular re-education, patient/family education, prosthetic training, manual techniques, passive ROM, dry needling, taping, vasopnuematic device, vestibular, spinal manipulations, joint manipulations  PLAN FOR NEXT SESSION: Assess HEP/update PRN, vestibular/balance, strengthening, canalith repositioning asReather Laurence   Dajaun Goldring, PT 09/21/2022, 1:46 PM  [facility] [address], [address] [place], [place] [address] Phone # [phone] Fax [phone]

## 2022-09-23 ENCOUNTER — Ambulatory Visit: Payer: Medicare HMO | Admitting: Rehabilitative and Restorative Service Providers"

## 2022-09-26 ENCOUNTER — Ambulatory Visit: Payer: Medicare HMO | Admitting: Rehabilitative and Restorative Service Providers"

## 2022-09-26 ENCOUNTER — Encounter: Payer: Self-pay | Admitting: Rehabilitative and Restorative Service Providers"

## 2022-09-26 DIAGNOSIS — R2689 Other abnormalities of gait and mobility: Secondary | ICD-10-CM

## 2022-09-26 DIAGNOSIS — M6281 Muscle weakness (generalized): Secondary | ICD-10-CM | POA: Diagnosis not present

## 2022-09-26 DIAGNOSIS — R293 Abnormal posture: Secondary | ICD-10-CM

## 2022-09-26 DIAGNOSIS — M5459 Other low back pain: Secondary | ICD-10-CM | POA: Diagnosis not present

## 2022-09-26 NOTE — Therapy (Signed)
OUTPATIENT PHYSICAL THERAPY VESTIBULAR TREATMENT NOTE   Patient Name: Kent Watts MRN: [id number] DOB:08/29/1950, 72 y.o., male Today's Date: 09/26/2022  END OF SESSION:  PT End of Session - 09/26/22 1236     Visit Number 6    Date for PT Re-Evaluation 10/11/22    Authorization Type [employer] MEDICARE    Progress Note Due on Visit 10    PT Start Time 1232    PT Stop Time 1310    PT Time Calculation (min) 38 min    Activity Tolerance Patient tolerated treatment well    Behavior During Therapy WFL for tasks assessed/performed             Past Medical History:  Diagnosis Date   Hypertension    Past Surgical History:  Procedure Laterality Date   KNEE SURGERY Right    There are no problems to display for this patient.   REFERRING PROVIDER: Roger Kill, PA-C   REFERRING DIAG: Repeated falls [R29.6], Personal history of fall [Z91.81], Other symptoms and signs involving the musculoskeletal system [R29.898], Other musculoskeletal symptoms referable to limbs(729.89) [R29.898]   THERAPY DIAG:  Other low back pain  Muscle weakness (generalized)  Other abnormalities of gait and mobility  Abnormal posture  Rationale for Evaluation and Treatment: Rehabilitation  ONSET DATE: Over a year ago is when he started falling   SUBJECTIVE:   SUBJECTIVE STATEMENT: Pt denies current dizziness.  Patient denies any falls.   States that his dizziness is approx 80% improved since initial evaluation.  States that the dizziness is worse first thing in the morning.  PERTINENT HISTORY: HTN, recent cataract surgery PAIN:  Are you having pain? Yes: NPRS scale: 0/10 Pain location: left lumbar  Pain description: Achiness,  Aggravating factors: Decreased activity,  Relieving factors: CBD oil  PRECAUTIONS: Fall  WEIGHT BEARING RESTRICTIONS: No  FALLS:  Has patient fallen in last 6 months? Yes. Number of falls 2  LIVING ENVIRONMENT: Lives with: lives with their spouse Lives  in: House/apartment Stairs: Yes: External: 3 steps; none Has following equipment at home: Single point cane, Walker - 4 wheeled, and shower chair  OCCUPATION: Retired.   PLOF: Independent  PATIENT GOALS: Improve balance, strength and activity tolerance.   OBJECTIVE:   DIAGNOSTIC FINDINGS: 1. No acute fracture. 2. 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 3. Nonobstructive right renal calculus.  COGNITION: Overall cognitive status: Within functional limits for tasks assessed     SENSATION: WFL  POSTURE: rounded shoulders, forward head, and decreased lumbar lordosis  PALPATION: None to palpation.   LOWER EXTREMITY ROM:  Active ROM Right eval Left eval  Hip flexion [facility]  Hip abduction [facility]  Hip internal rotation [facility]  Hip external rotation [facility]  Knee flexion [facility] WFL  Knee extension 95%  WFL  Ankle dorsiflexion [facility] WFL  Ankle plantarflexion WFL WFL   (Blank rows = not tested)  LOWER EXTREMITY MMT:  MMT Right eval Left eval  Hip flexion 5 5  Hip extension 5 5  Hip abduction 4 4  Knee flexion 5 5  Knee extension 5 5  Ankle dorsiflexion 4+ 4+  Ankle inversion 4+ 5  Ankle eversion 4+ 5   (Blank rows = not tested)  FUNCTIONAL TESTS:  Eval: 5 times sit to stand: 24.64sec with reports of pain in bilat knees.  Timed up and go (TUG): 16.06 sec   09/15/2022: 3 minute walk:  577 ft  GAIT: Distance walked: 47ft   Assistive device  utilized: None Level of assistance: Complete Independence Comments: Decreased trunk rotation, decreased arm swing, decreased stance length.   BERG BALANCE TEST Eval: Sitting to Standing: 4.      Stands without using hands and stabilize independently Standing Unsupported: 4.      Stands safely for 2 minutes Sitting Unsupported: 4.     Sits for 2 minutes independently Standing to Sitting: 4.     Sits safely with minimal use of hands Transfers: 4.     Transfers safely with minor use of hands Standing with eyes  closed: 4.     Stands safely for 10 seconds  Standing with feet together: 4.     Stands for 1 minute safely Reaching forward with outstretched arm: 4.     Reaches forward 10 inches Retrieving object from the floor: 1.     Unable to pick up and needs supervision Turning to look behind: 3.     Looks behind one side only, other side less weight shift Turning 360 degrees: 3.     Able to turn on one side in </= 4 seconds Place alternate foot on stool: 3.     Completes 8 steps in >20 seconds Standing with one foot in front: 2.     Independent small step for 30 seconds Standing on one foot: 4.     Holds >10 seconds  Total Score: 48/56  VESTIBULAR ASSESSMENT: 09/06/2022: Some decreased speed/slowness with tracking, especially towards left side Saccades are WFL Gaze stabilization is WFL Difficult to assess head thrust as pt had difficulty relaxing (but pt does report dizziness with fast head motions at times) [name] positive on each side with symptoms approx 10 sec    TODAY'S TREATMENT:                                                                                                                               DATE: 09/26/2022 Nustep level 5 x6 min with PT present to discuss status Canalith repositioning with Epley on left side x2 with less symptoms noted on second trial Tandem gait on AirEx beam in parallel bars x3 laps down and back without UE support Side stepping on AirEx beam in parallel bars x4 laps each direction without UE support Backwards walking on treadmill -0.6 mph x4 min Standing on purple foam performing head rotation and head nods x1 min each with SBA Marching on trampoline 2x1 min   DATE: 09/21/2022 Nustep level 4 x6 min with PT present to discuss status Canalith repositioning with Epley on right side x1 with symptoms improving Canalith repositioning with Epley on left side x2 with less symptoms noted on second trial Tandem gait at barre x4 laps down and back Single  leg stance at barre with UE support as needed 2x20 sec bilat Rocker board x2 min Sit to stand x10 Alt LE toe tap to 6" step 2x10 bilat   DATE: 09/15/2022 Nustep level 4 x6 min with  PT present to discuss status  3 minutes ambulation around PT gym Standing on purple foam with eyes closed 3x10 sec with close SBA/CGA Standing on purple foam performing head rotation and head nods x1 min each with SBA/CGA Seated hamstring stretch 2x20 sec bilat Seated blue pball rollout 3x10 sec Tandem gait on AirEx beam in parallel bars x3 laps down and back without UE support Side stepping on AirEx beam in parallel bars x4 laps each direction without UE support Manual Therapy:  Pt in prone using Addaday to left side lumbar paraspinals, glutes/piriformis, hamstring     PATIENT EDUCATION:  Education details: Issued HEP Person educated: Patient Education method: Explanation Education comprehension: verbalized understanding  HOME EXERCISE PROGRAM: Access Code: [email or web address] URL: [email or web address] Date: 09/21/2022 Prepared by: Clydie Braun Alinah Sheard  Exercises - Pencil Pushups  - 1 x daily - 7 x weekly - 2 sets - 10 reps - Seated Horizontal Smooth Pursuit  - 1 x daily - 7 x weekly - 1 sets - 26 reps - Seated Gaze Stabilization with Head Rotation  - 1 x daily - 7 x weekly - 1 sets - 20 reps - Seated Gaze Stabilization with Head Nod  - 1 x daily - 7 x weekly - 1 sets - 20 reps - Brandt-Daroff Vestibular Exercise  - 1 x daily - 7 x weekly - 1 sets - 3 reps - Tandem Walking with Counter Support  - 1 x daily - 7 x weekly - 2 sets - 10 reps - Single Leg Stance with Support  - 1 x daily - 7 x weekly - 1 sets - 2 reps - 20 sec hold - Sit to Stand with Arms Crossed  - 1 x daily - 7 x weekly - 2 sets - 10 reps  Patient Education - BPPV   ASSESSMENT:  CLINICAL IMPRESSION:  Mr. Coward presents to skilled PT with denying pain and stating improvement in his dizziness.  Patient continues with positive [name] to left, so proceeded with canalith repositioning x2.  On second Epley maneuver, pt only briefly had nystagmus noted on first position.  Patient continues to progress towards improved overall balance and did not require UE support with balance activities on AirEx beam.  Patient continues to progress towards goal related activities.  OBJECTIVE IMPAIRMENTS: decreased activity tolerance, difficulty walking, decreased balance, decreased endurance, decreased mobility, decreased ROM, decreased strength, impaired flexibility, impaired UE/LE use, postural dysfunction, and pain.  ACTIVITY LIMITATIONS: bending, lifting, carry, locomotion, cleaning, community activity, driving, and or occupation  PERSONAL FACTORS: HTN, frequent falls are also affecting patient's functional outcome.  REHAB POTENTIAL: Good  CLINICAL DECISION MAKING: Stable/uncomplicated  EVALUATION COMPLEXITY: Moderate    GOALS: Short term PT Goals Target date: 09/13/2022  Pt will be I and compliant with HEP. Baseline:  Goal status: MET  Long term PT goals Target date: 10/11/2022 Pt will improve ankle strength to at least 5-/5 MMT to improve functional strength Baseline: Goal status: New  Pt will improve BERG by at least 3 points in order to demonstrate clinically significant improvement in balance.  Baseline:  48/56 Goal status: IN PROGRESS  Pt will reduce pain by overall 50% overall with usual activity Baseline: Goal status: IN PROGRESS  Pt will improve TUG by 3.4 seconds or Minimal clinically important difference.  Baseline:  16.06 sec Goal status: New  Pt will improve his STS by 2.3 seconds for Minimal clinically important difference.  Baseline:  24.64 sec Goal status: New  Pt will be able  to ambulate community distances at least 1000 ft WNL gait pattern without complaints Baseline: Goal status: ONGOING         7. Pt will report no dizziness with positional changes.  Baseline: Goal status: ONGOING          8. Pt will deny any new falls throughout duration of PT.  Baseline: Goal status: ONGOING   PLAN: PT FREQUENCY: 1-2 times per week   PT DURATION: 6 weeks  PLANNED INTERVENTIONS (unless contraindicated): aquatic PT, Canalith repositioning, cryotherapy, Electrical stimulation, Iontophoresis with 4 mg/ml dexamethasome, Moist heat, traction, Ultrasound, gait training, Therapeutic exercise, balance training, neuromuscular re-education, patient/family education, prosthetic training, manual techniques, passive ROM, dry needling, taping, vasopnuematic device, vestibular, spinal manipulations, joint manipulations  PLAN FOR NEXT SESSION: Assess HEP/update PRN, vestibular/balance, strengthening, canalith repositioning as needed      Reather Laurence, PT 09/26/2022, 1:19 PM  [facility] [address] [place], [place] [address] Phone # [phone] Fax [phone]

## 2022-09-28 ENCOUNTER — Ambulatory Visit: Payer: Medicare HMO | Admitting: Rehabilitative and Restorative Service Providers"

## 2022-09-28 ENCOUNTER — Encounter: Payer: Self-pay | Admitting: Rehabilitative and Restorative Service Providers"

## 2022-09-28 DIAGNOSIS — M5459 Other low back pain: Secondary | ICD-10-CM

## 2022-09-28 DIAGNOSIS — R293 Abnormal posture: Secondary | ICD-10-CM

## 2022-09-28 DIAGNOSIS — R2689 Other abnormalities of gait and mobility: Secondary | ICD-10-CM

## 2022-09-28 DIAGNOSIS — M6281 Muscle weakness (generalized): Secondary | ICD-10-CM | POA: Diagnosis not present

## 2022-09-28 NOTE — Therapy (Signed)
OUTPATIENT PHYSICAL THERAPY VESTIBULAR TREATMENT NOTE   Patient Name: Kent Watts MRN: [id number] DOB:08-Nov-1950, 72 y.o., male Today's Date: 09/28/2022  END OF SESSION:  PT End of Session - 09/28/22 1236     Visit Number 7    Date for PT Re-Evaluation 10/11/22    Authorization Type [employer] MEDICARE    Progress Note Due on Visit 10    PT Start Time 1232    PT Stop Time 1310    PT Time Calculation (min) 38 min    Activity Tolerance Patient tolerated treatment well    Behavior During Therapy WFL for tasks assessed/performed             Past Medical History:  Diagnosis Date   Hypertension    Past Surgical History:  Procedure Laterality Date   KNEE SURGERY Right    There are no problems to display for this patient.   REFERRING PROVIDER: Roger Kill, PA-C   REFERRING DIAG: Repeated falls [R29.6], Personal history of fall [Z91.81], Other symptoms and signs involving the musculoskeletal system [R29.898], Other musculoskeletal symptoms referable to limbs(729.89) [R29.898]   THERAPY DIAG:  Other low back pain  Muscle weakness (generalized)  Other abnormalities of gait and mobility  Abnormal posture  Rationale for Evaluation and Treatment: Rehabilitation  ONSET DATE: Over a year ago is when he started falling   SUBJECTIVE:   SUBJECTIVE STATEMENT: Pt reports improved dizziness and denies any new falls.  Denies any pain.  PERTINENT HISTORY: HTN, recent cataract surgery PAIN:  Are you having pain? Yes: NPRS scale: 0/10 Pain location: left lumbar  Pain description: Achiness,  Aggravating factors: Decreased activity,  Relieving factors: CBD oil  PRECAUTIONS: Fall  WEIGHT BEARING RESTRICTIONS: No  FALLS:  Has patient fallen in last 6 months? Yes. Number of falls 2  LIVING ENVIRONMENT: Lives with: lives with their spouse Lives in: House/apartment Stairs: Yes: External: 3 steps; none Has following equipment at home: Single point cane, Walker - 4  wheeled, and shower chair  OCCUPATION: Retired.   PLOF: Independent  PATIENT GOALS: Improve balance, strength and activity tolerance.   OBJECTIVE:   DIAGNOSTIC FINDINGS: 1. No acute fracture. 2. 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 3. Nonobstructive right renal calculus.  COGNITION: Overall cognitive status: Within functional limits for tasks assessed     SENSATION: WFL  POSTURE: rounded shoulders, forward head, and decreased lumbar lordosis  PALPATION: None to palpation.   LOWER EXTREMITY ROM:  Active ROM Right eval Left eval  Hip flexion [facility]  Hip abduction [facility]  Hip internal rotation [facility]  Hip external rotation [facility]  Knee flexion [facility] WFL  Knee extension 95%  WFL  Ankle dorsiflexion [facility] WFL  Ankle plantarflexion WFL WFL   (Blank rows = not tested)  LOWER EXTREMITY MMT:  MMT Right eval Left eval  Hip flexion 5 5  Hip extension 5 5  Hip abduction 4 4  Knee flexion 5 5  Knee extension 5 5  Ankle dorsiflexion 4+ 4+  Ankle inversion 4+ 5  Ankle eversion 4+ 5   (Blank rows = not tested)  FUNCTIONAL TESTS:  Eval: 5 times sit to stand: 24.64sec with reports of pain in bilat knees.  Timed up and go (TUG): 16.06 sec   09/15/2022: 3 minute walk:  577 ft  09/28/2022: 5 times sit to stand: 12.4 sec Timed up and go (TUG): 7.32 sec   GAIT: Distance walked: 63ft   Assistive device utilized: None Level of  assistance: Complete Independence Comments: Decreased trunk rotation, decreased arm swing, decreased stance length.   BERG BALANCE TEST Eval: Sitting to Standing: 4.      Stands without using hands and stabilize independently Standing Unsupported: 4.      Stands safely for 2 minutes Sitting Unsupported: 4.     Sits for 2 minutes independently Standing to Sitting: 4.     Sits safely with minimal use of hands Transfers: 4.     Transfers safely with minor use of hands Standing with eyes closed: 4.     Stands safely for 10 seconds   Standing with feet together: 4.     Stands for 1 minute safely Reaching forward with outstretched arm: 4.     Reaches forward 10 inches Retrieving object from the floor: 1.     Unable to pick up and needs supervision Turning to look behind: 3.     Looks behind one side only, other side less weight shift Turning 360 degrees: 3.     Able to turn on one side in </= 4 seconds Place alternate foot on stool: 3.     Completes 8 steps in >20 seconds Standing with one foot in front: 2.     Independent small step for 30 seconds Standing on one foot: 4.     Holds >10 seconds  Total Score: 48/56  VESTIBULAR ASSESSMENT: 09/06/2022: Some decreased speed/slowness with tracking, especially towards left side Saccades are WFL Gaze stabilization is WFL Difficult to assess head thrust as pt had difficulty relaxing (but pt does report dizziness with fast head motions at times) [name] positive on each side with symptoms approx 10 sec    TODAY'S TREATMENT:                                                                                                                               DATE: 09/28/2022 Nustep level 5 x6 min with PT present to discuss status 5 times sit to/from stand and TUG Canalith repositioning with Epley on left side x2 with less symptoms noted on second trial Standing on flat side of bosu for static balance 2x1 min FWD step ups onto bosu x10 bilat Side step up and overs on bosu x10 bilat Standing on purple foam with eyes closed 2x20 sec Tandem stance on purple foam x20 sec with each foot leading   DATE: 09/26/2022 Nustep level 5 x6 min with PT present to discuss status Canalith repositioning with Epley on left side x2 with less symptoms noted on second trial Tandem gait on AirEx beam in parallel bars x3 laps down and back without UE support Side stepping on AirEx beam in parallel bars x4 laps each direction without UE support Backwards walking on treadmill -0.6 mph x4 min Standing  on purple foam performing head rotation and head nods x1 min each with SBA Marching on trampoline 2x1 min   DATE: 09/21/2022 Nustep level 4 x6 min with  PT present to discuss status Canalith repositioning with Epley on right side x1 with symptoms improving Canalith repositioning with Epley on left side x2 with less symptoms noted on second trial Tandem gait at barre x4 laps down and back Single leg stance at barre with UE support as needed 2x20 sec bilat Rocker board x2 min Sit to stand x10 Alt LE toe tap to 6" step 2x10 bilat     PATIENT EDUCATION:  Education details: Issued HEP Person educated: Patient Education method: Explanation Education comprehension: verbalized understanding  HOME EXERCISE PROGRAM: Access Code: [email or web address] URL: [email or web address] Date: 09/21/2022 Prepared by: Clydie Braun Elexa Kivi  Exercises - Pencil Pushups  - 1 x daily - 7 x weekly - 2 sets - 10 reps - Seated Horizontal Smooth Pursuit  - 1 x daily - 7 x weekly - 1 sets - 26 reps - Seated Gaze Stabilization with Head Rotation  - 1 x daily - 7 x weekly - 1 sets - 20 reps - Seated Gaze Stabilization with Head Nod  - 1 x daily - 7 x weekly - 1 sets - 20 reps - Brandt-Daroff Vestibular Exercise  - 1 x daily - 7 x weekly - 1 sets - 3 reps - Tandem Walking with Counter Support  - 1 x daily - 7 x weekly - 2 sets - 10 reps - Single Leg Stance with Support  - 1 x daily - 7 x weekly - 1 sets - 2 reps - 20 sec hold - Sit to Stand with Arms Crossed  - 1 x daily - 7 x weekly - 2 sets - 10 reps  Patient Education - BPPV   ASSESSMENT:  CLINICAL IMPRESSION:  Mr. Ruddick presents to skilled PT denying pain and reporting overall less dizziness.  Patient continues to have positive [name] on left (symptoms lasting for 10 seconds on first Epley maneuver) and proceeded with Canalith repositioning.  Patient continues to progress with static and dynamic balance during PT sessions.  Patient has improved time on  TUG and 5 times sit to stand and has met long term goals.  Patient continues to require skilled PT to progress towards goal related activities and decreased balance.  OBJECTIVE IMPAIRMENTS: decreased activity tolerance, difficulty walking, decreased balance, decreased endurance, decreased mobility, decreased ROM, decreased strength, impaired flexibility, impaired UE/LE use, postural dysfunction, and pain.  ACTIVITY LIMITATIONS: bending, lifting, carry, locomotion, cleaning, community activity, driving, and or occupation  PERSONAL FACTORS: HTN, frequent falls are also affecting patient's functional outcome.  REHAB POTENTIAL: Good  CLINICAL DECISION MAKING: Stable/uncomplicated  EVALUATION COMPLEXITY: Moderate    GOALS: Short term PT Goals Target date: 09/13/2022  Pt will be I and compliant with HEP. Baseline:  Goal status: MET  Long term PT goals Target date: 10/11/2022 Pt will improve ankle strength to at least 5-/5 MMT to improve functional strength Baseline: Goal status: New  Pt will improve BERG by at least 3 points in order to demonstrate clinically significant improvement in balance.  Baseline:  48/56 Goal status: IN PROGRESS  Pt will reduce pain by overall 50% overall with usual activity Baseline: Goal status: IN PROGRESS  Pt will improve TUG by 3.4 seconds or Minimal clinically important difference.  Baseline:  16.06 sec Goal status: MET on 09/28/2022  Pt will improve his STS by 2.3 seconds for Minimal clinically important difference.  Baseline:  24.64 sec Goal status: MET on 09/28/2022  Pt will be able to ambulate community distances at least 1000 ft WNL gait pattern without  complaints Baseline: Goal status: ONGOING         7. Pt will report no dizziness with positional changes.  Baseline: Goal status: ONGOING         8. Pt will deny any new falls throughout duration of PT.  Baseline: Goal status: ONGOING   PLAN: PT FREQUENCY: 1-2 times per week   PT  DURATION: 6 weeks  PLANNED INTERVENTIONS (unless contraindicated): aquatic PT, Canalith repositioning, cryotherapy, Electrical stimulation, Iontophoresis with 4 mg/ml dexamethasome, Moist heat, traction, Ultrasound, gait training, Therapeutic exercise, balance training, neuromuscular re-education, patient/family education, prosthetic training, manual techniques, passive ROM, dry needling, taping, vasopnuematic device, vestibular, spinal manipulations, joint manipulations  PLAN FOR NEXT SESSION: Assess HEP/update PRN, vestibular/balance, strengthening, canalith repositioning as needed      Reather Laurence, PT 09/28/2022, 1:31 PM  [facility] [address], [address] [place], [place] [address] Phone # [phone] Fax [phone]

## 2022-10-03 ENCOUNTER — Ambulatory Visit: Payer: Medicare HMO | Admitting: Rehabilitative and Restorative Service Providers"

## 2022-10-03 ENCOUNTER — Encounter: Payer: Self-pay | Admitting: Rehabilitative and Restorative Service Providers"

## 2022-10-03 DIAGNOSIS — M5459 Other low back pain: Secondary | ICD-10-CM

## 2022-10-03 DIAGNOSIS — M6281 Muscle weakness (generalized): Secondary | ICD-10-CM | POA: Diagnosis not present

## 2022-10-03 DIAGNOSIS — R293 Abnormal posture: Secondary | ICD-10-CM | POA: Diagnosis not present

## 2022-10-03 DIAGNOSIS — R2689 Other abnormalities of gait and mobility: Secondary | ICD-10-CM | POA: Diagnosis not present

## 2022-10-03 NOTE — Therapy (Signed)
OUTPATIENT PHYSICAL THERAPY VESTIBULAR TREATMENT NOTE   Patient Name: Kent Watts MRN: [id number] DOB:[birth year], 72 y.o., male Today's Date: 10/03/2022  END OF SESSION:  PT End of Session - 10/03/22 1234     Visit Number 8    Date for PT Re-Evaluation 10/11/22    Authorization Type [employer] MEDICARE    Progress Note Due on Visit 10    PT Start Time 1230    PT Stop Time 1310    PT Time Calculation (min) 40 min    Activity Tolerance Patient tolerated treatment well    Behavior During Therapy WFL for tasks assessed/performed             Past Medical History:  Diagnosis Date   Hypertension    Past Surgical History:  Procedure Laterality Date   KNEE SURGERY Right    There are no problems to display for this patient.   REFERRING PROVIDER: Roger Kill, PA-C   REFERRING DIAG: Repeated falls [R29.6], Personal history of fall [Z91.81], Other symptoms and signs involving the musculoskeletal system [R29.898], Other musculoskeletal symptoms referable to limbs(729.89) [R29.898]   THERAPY DIAG:  Other low back pain  Muscle weakness (generalized)  Other abnormalities of gait and mobility  Abnormal posture  Rationale for Evaluation and Treatment: Rehabilitation  ONSET DATE: Over a year ago is when he started falling   SUBJECTIVE:   SUBJECTIVE STATEMENT: Pt denies any falls, but continues to report dizziness.  States that he is still doing HEP.  Denies pain.  PERTINENT HISTORY: HTN, recent cataract surgery PAIN:  Are you having pain? Yes: NPRS scale: 0/10 Pain location: left lumbar  Pain description: Achiness,  Aggravating factors: Decreased activity,  Relieving factors: CBD oil  PRECAUTIONS: Fall  WEIGHT BEARING RESTRICTIONS: No  FALLS:  Has patient fallen in last 6 months? Yes. Number of falls 2  LIVING ENVIRONMENT: Lives with: lives with their spouse Lives in: House/apartment Stairs: Yes: External: 3 steps; none Has following equipment at  home: Single point cane, Walker - 4 wheeled, and shower chair  OCCUPATION: Retired.   PLOF: Independent  PATIENT GOALS: Improve balance, strength and activity tolerance.   OBJECTIVE:   DIAGNOSTIC FINDINGS: 1. No acute fracture. 2. 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 3. Nonobstructive right renal calculus.  COGNITION: Overall cognitive status: Within functional limits for tasks assessed     SENSATION: WFL  POSTURE: rounded shoulders, forward head, and decreased lumbar lordosis  PALPATION: None to palpation.   LOWER EXTREMITY ROM:  Active ROM Right eval Left eval  Hip flexion [facility]  Hip abduction [facility]  Hip internal rotation [facility]  Hip external rotation [facility]  Knee flexion [facility] WFL  Knee extension 95%  WFL  Ankle dorsiflexion [facility] WFL  Ankle plantarflexion WFL WFL   (Blank rows = not tested)  LOWER EXTREMITY MMT:  MMT Right eval Left eval  Hip flexion 5 5  Hip extension 5 5  Hip abduction 4 4  Knee flexion 5 5  Knee extension 5 5  Ankle dorsiflexion 4+ 4+  Ankle inversion 4+ 5  Ankle eversion 4+ 5   (Blank rows = not tested)  FUNCTIONAL TESTS:  Eval: 5 times sit to stand: 24.64sec with reports of pain in bilat knees.  Timed up and go (TUG): 16.06 sec   09/15/2022: 3 minute walk:  577 ft  09/28/2022: 5 times sit to stand: 12.4 sec Timed up and go (TUG): 7.32 sec   GAIT: Distance walked: 40ft  Assistive device utilized: None Level of assistance: Complete Independence Comments: Decreased trunk rotation, decreased arm swing, decreased stance length.   BERG BALANCE TEST Eval: Sitting to Standing: 4.      Stands without using hands and stabilize independently Standing Unsupported: 4.      Stands safely for 2 minutes Sitting Unsupported: 4.     Sits for 2 minutes independently Standing to Sitting: 4.     Sits safely with minimal use of hands Transfers: 4.     Transfers safely with minor use of hands Standing with eyes closed: 4.      Stands safely for 10 seconds  Standing with feet together: 4.     Stands for 1 minute safely Reaching forward with outstretched arm: 4.     Reaches forward 10 inches Retrieving object from the floor: 1.     Unable to pick up and needs supervision Turning to look behind: 3.     Looks behind one side only, other side less weight shift Turning 360 degrees: 3.     Able to turn on one side in </= 4 seconds Place alternate foot on stool: 3.     Completes 8 steps in >20 seconds Standing with one foot in front: 2.     Independent small step for 30 seconds Standing on one foot: 4.     Holds >10 seconds  Total Score: 48/56  VESTIBULAR ASSESSMENT: 09/06/2022: Some decreased speed/slowness with tracking, especially towards left side Saccades are WFL Gaze stabilization is WFL Difficult to assess head thrust as pt had difficulty relaxing (but pt does report dizziness with fast head motions at times) [name] positive on each side with symptoms approx 10 sec    TODAY'S TREATMENT:                                                                                                                               DATE: 10/03/2022 Nustep level 5 x6 min with PT present to discuss status Sit to/from stand on pink foam 2x5 Standing on purple foam with eyes closed 2x20 sec Standing on purple foam performing head nods and head rotations x1 min each Ambulation down PT gym with head nods and head rotations x2 laps each Canalith repositioning with Epley on left side x2 with less symptoms noted on second trial   DATE: 09/28/2022 Nustep level 5 x6 min with PT present to discuss status 5 times sit to/from stand and TUG Canalith repositioning with Epley on left side x2 with less symptoms noted on second trial Standing on flat side of bosu for static balance 2x1 min FWD step ups onto bosu x10 bilat Side step up and overs on bosu x10 bilat Standing on purple foam with eyes closed 2x20 sec Tandem stance on purple  foam x20 sec with each foot leading   DATE: 09/26/2022 Nustep level 5 x6 min with PT present to discuss status Canalith repositioning with Epley on left  side x2 with less symptoms noted on second trial Tandem gait on AirEx beam in parallel bars x3 laps down and back without UE support Side stepping on AirEx beam in parallel bars x4 laps each direction without UE support Backwards walking on treadmill -0.6 mph x4 min Standing on purple foam performing head rotation and head nods x1 min each with SBA Marching on trampoline 2x1 min    PATIENT EDUCATION:  Education details: Issued HEP Person educated: Patient Education method: Explanation Education comprehension: verbalized understanding  HOME EXERCISE PROGRAM: Access Code: [email or web address] URL: [email or web address] Date: 09/21/2022 Prepared by: Clydie Braun Rishikesh Khachatryan  Exercises - Pencil Pushups  - 1 x daily - 7 x weekly - 2 sets - 10 reps - Seated Horizontal Smooth Pursuit  - 1 x daily - 7 x weekly - 1 sets - 26 reps - Seated Gaze Stabilization with Head Rotation  - 1 x daily - 7 x weekly - 1 sets - 20 reps - Seated Gaze Stabilization with Head Nod  - 1 x daily - 7 x weekly - 1 sets - 20 reps - Brandt-Daroff Vestibular Exercise  - 1 x daily - 7 x weekly - 1 sets - 3 reps - Tandem Walking with Counter Support  - 1 x daily - 7 x weekly - 2 sets - 10 reps - Single Leg Stance with Support  - 1 x daily - 7 x weekly - 1 sets - 2 reps - 20 sec hold - Sit to Stand with Arms Crossed  - 1 x daily - 7 x weekly - 2 sets - 10 reps  Patient Education - BPPV   ASSESSMENT:  CLINICAL IMPRESSION:  Mr. Pfenning presents to skilled PT denying pain and reporting that he continues to have dizziness.  Patient without any new falls.  Patient continues with positive [name] for approximately 5 seconds on first Epley Maneuver and less on second maneuver.  Patient able to progress with dynamic balance and vestibular balance exercises during session  today, but continue to provide close SBA secondary to instability and history of falls.  OBJECTIVE IMPAIRMENTS: decreased activity tolerance, difficulty walking, decreased balance, decreased endurance, decreased mobility, decreased ROM, decreased strength, impaired flexibility, impaired UE/LE use, postural dysfunction, and pain.  ACTIVITY LIMITATIONS: bending, lifting, carry, locomotion, cleaning, community activity, driving, and or occupation  PERSONAL FACTORS: HTN, frequent falls are also affecting patient's functional outcome.  REHAB POTENTIAL: Good  CLINICAL DECISION MAKING: Stable/uncomplicated  EVALUATION COMPLEXITY: Moderate    GOALS: Short term PT Goals Target date: 09/13/2022  Pt will be I and compliant with HEP. Baseline:  Goal status: MET  Long term PT goals Target date: 10/11/2022 Pt will improve ankle strength to at least 5-/5 MMT to improve functional strength Baseline: Goal status: New  Pt will improve BERG by at least 3 points in order to demonstrate clinically significant improvement in balance.  Baseline:  48/56 Goal status: IN PROGRESS  Pt will reduce pain by overall 50% overall with usual activity Baseline: Goal status: IN PROGRESS  Pt will improve TUG by 3.4 seconds or Minimal clinically important difference.  Baseline:  16.06 sec Goal status: MET on 09/28/2022  Pt will improve his STS by 2.3 seconds for Minimal clinically important difference.  Baseline:  24.64 sec Goal status: MET on 09/28/2022  Pt will be able to ambulate community distances at least 1000 ft WNL gait pattern without complaints Baseline: Goal status: ONGOING         7. Pt will report no  dizziness with positional changes.  Baseline: Goal status: ONGOING         8. Pt will deny any new falls throughout duration of PT.  Baseline: Goal status: ONGOING   PLAN: PT FREQUENCY: 1-2 times per week   PT DURATION: 6 weeks  PLANNED INTERVENTIONS (unless contraindicated): aquatic PT,  Canalith repositioning, cryotherapy, Electrical stimulation, Iontophoresis with 4 mg/ml dexamethasome, Moist heat, traction, Ultrasound, gait training, Therapeutic exercise, balance training, neuromuscular re-education, patient/family education, prosthetic training, manual techniques, passive ROM, dry needling, taping, vasopnuematic device, vestibular, spinal manipulations, joint manipulations  PLAN FOR NEXT SESSION: Assess HEP/update PRN, vestibular/balance, strengthening, canalith repositioning as needed      Luka Stohr, PT 10/03/2022, 1:16 PM  [facility] [address], [address] [place], [place] [address] Phone # [phone] Fax [phone]

## 2022-10-05 ENCOUNTER — Ambulatory Visit: Payer: Medicare HMO | Attending: Physician Assistant | Admitting: Rehabilitative and Restorative Service Providers"

## 2022-10-05 ENCOUNTER — Encounter: Payer: Self-pay | Admitting: Rehabilitative and Restorative Service Providers"

## 2022-10-05 DIAGNOSIS — R293 Abnormal posture: Secondary | ICD-10-CM | POA: Insufficient documentation

## 2022-10-05 DIAGNOSIS — R42 Dizziness and giddiness: Secondary | ICD-10-CM | POA: Diagnosis not present

## 2022-10-05 DIAGNOSIS — M5459 Other low back pain: Secondary | ICD-10-CM | POA: Diagnosis not present

## 2022-10-05 DIAGNOSIS — R2689 Other abnormalities of gait and mobility: Secondary | ICD-10-CM | POA: Diagnosis not present

## 2022-10-05 DIAGNOSIS — M6281 Muscle weakness (generalized): Secondary | ICD-10-CM | POA: Diagnosis not present

## 2022-10-05 NOTE — Therapy (Signed)
OUTPATIENT PHYSICAL THERAPY VESTIBULAR TREATMENT NOTE   Patient Name: Kent Watts MRN: [id number] DOB:29-Dec-1950, 72 y.o., male Today's Date: 10/05/2022  END OF SESSION:  PT End of Session - 10/05/22 1237     Visit Number 9    Date for PT Re-Evaluation 10/11/22    Authorization Type [employer] MEDICARE    Progress Note Due on Visit 10    PT Start Time 1235    PT Stop Time 1315    PT Time Calculation (min) 40 min    Activity Tolerance Patient tolerated treatment well    Behavior During Therapy WFL for tasks assessed/performed             Past Medical History:  Diagnosis Date   Hypertension    Past Surgical History:  Procedure Laterality Date   KNEE SURGERY Right    There are no problems to display for this patient.   REFERRING PROVIDER: Roger Kill, PA-C   REFERRING DIAG: Repeated falls [R29.6], Personal history of fall [Z91.81], Other symptoms and signs involving the musculoskeletal system [R29.898], Other musculoskeletal symptoms referable to limbs(729.89) [R29.898]   THERAPY DIAG:  Other low back pain  Muscle weakness (generalized)  Other abnormalities of gait and mobility  Abnormal posture  Rationale for Evaluation and Treatment: Rehabilitation  ONSET DATE: Over a year ago is when he started falling   SUBJECTIVE:   SUBJECTIVE STATEMENT: Pt reports that an alarm went off and his wife was trying to grab his left arm and when she pulled, he started having some pain.  Patient reports that his dizziness is improving, but he is still having some dizziness when he bends forward and when he gets out of bed quickly.  PERTINENT HISTORY: HTN, recent cataract surgery PAIN:  Are you having pain? Yes: NPRS scale: 8/10 Pain location: left arm Pain description: sharp  Aggravating factors: certain movements Relieving factors: CBD oil  PRECAUTIONS: Fall  WEIGHT BEARING RESTRICTIONS: No  FALLS:  Has patient fallen in last 6 months? Yes. Number of falls  2  LIVING ENVIRONMENT: Lives with: lives with their spouse Lives in: House/apartment Stairs: Yes: External: 3 steps; none Has following equipment at home: Single point cane, Walker - 4 wheeled, and shower chair  OCCUPATION: Retired.   PLOF: Independent  PATIENT GOALS: Improve balance, strength and activity tolerance.   OBJECTIVE:   DIAGNOSTIC FINDINGS: 1. No acute fracture. 2. 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 3. Nonobstructive right renal calculus.  COGNITION: Overall cognitive status: Within functional limits for tasks assessed     SENSATION: WFL  POSTURE: rounded shoulders, forward head, and decreased lumbar lordosis  PALPATION: None to palpation.   LOWER EXTREMITY ROM:  Active ROM Right eval Left eval  Hip flexion [facility]  Hip abduction [facility]  Hip internal rotation [facility]  Hip external rotation [facility]  Knee flexion [facility] WFL  Knee extension 95%  WFL  Ankle dorsiflexion [facility] WFL  Ankle plantarflexion WFL WFL   (Blank rows = not tested)  LOWER EXTREMITY MMT:  MMT Right eval Left eval  Hip flexion 5 5  Hip extension 5 5  Hip abduction 4 4  Knee flexion 5 5  Knee extension 5 5  Ankle dorsiflexion 4+ 4+  Ankle inversion 4+ 5  Ankle eversion 4+ 5   (Blank rows = not tested)  FUNCTIONAL TESTS:  Eval: 5 times sit to stand: 24.64sec with reports of pain in bilat knees.  Timed up and go (TUG): 16.06 sec  09/15/2022: 3 minute walk:  577 ft  09/28/2022: 5 times sit to stand: 12.4 sec Timed up and go (TUG): 7.32 sec   GAIT: Distance walked: 63ft   Assistive device utilized: None Level of assistance: Complete Independence Comments: Decreased trunk rotation, decreased arm swing, decreased stance length.   BERG BALANCE TEST Eval: Sitting to Standing: 4.      Stands without using hands and stabilize independently Standing Unsupported: 4.      Stands safely for 2 minutes Sitting Unsupported: 4.     Sits for 2 minutes  independently Standing to Sitting: 4.     Sits safely with minimal use of hands Transfers: 4.     Transfers safely with minor use of hands Standing with eyes closed: 4.     Stands safely for 10 seconds  Standing with feet together: 4.     Stands for 1 minute safely Reaching forward with outstretched arm: 4.     Reaches forward 10 inches Retrieving object from the floor: 1.     Unable to pick up and needs supervision Turning to look behind: 3.     Looks behind one side only, other side less weight shift Turning 360 degrees: 3.     Able to turn on one side in </= 4 seconds Place alternate foot on stool: 3.     Completes 8 steps in >20 seconds Standing with one foot in front: 2.     Independent small step for 30 seconds Standing on one foot: 4.     Holds >10 seconds  Total Score: 48/56  VESTIBULAR ASSESSMENT: 09/06/2022: Some decreased speed/slowness with tracking, especially towards left side Saccades are WFL Gaze stabilization is WFL Difficult to assess head thrust as pt had difficulty relaxing (but pt does report dizziness with fast head motions at times) [name] positive on each side with symptoms approx 10 sec    TODAY'S TREATMENT:                                                                                                                               DATE: 10/05/2022 Nustep level 5 x6 min with PT present to discuss status Sit to/from stand on pink foam 2x5 Standing on purple foam with eyes closed 2x20 sec Standing on purple foam performing head nods and head rotations x1 min each Ambulation down PT gym with head nods and head rotations x2 laps each Side stepping performing ball toss on wall 2x10 ft each direction Canalith repositioning with Epley on left side x3 with less symptoms noted on second trial and nearly no nystagmus noted on 3rd trial Tandem gait x12 ft without UE support   DATE: 10/03/2022 Nustep level 5 x6 min with PT present to discuss status Sit to/from  stand on pink foam 2x5 Standing on purple foam with eyes closed 2x20 sec Standing on purple foam performing head nods and head rotations x1 min each Ambulation down PT gym  with head nods and head rotations x2 laps each Canalith repositioning with Epley on left side x2 with less symptoms noted on second trial   DATE: 09/28/2022 Nustep level 5 x6 min with PT present to discuss status 5 times sit to/from stand and TUG Canalith repositioning with Epley on left side x2 with less symptoms noted on second trial Standing on flat side of bosu for static balance 2x1 min FWD step ups onto bosu x10 bilat Side step up and overs on bosu x10 bilat Standing on purple foam with eyes closed 2x20 sec Tandem stance on purple foam x20 sec with each foot leading     PATIENT EDUCATION:  Education details: Issued HEP Person educated: Patient Education method: Explanation Education comprehension: verbalized understanding  HOME EXERCISE PROGRAM: Access Code: [email or web address] URL: [email or web address] Date: 09/21/2022 Prepared by: Clydie Braun Cathalina Barcia  Exercises - Pencil Pushups  - 1 x daily - 7 x weekly - 2 sets - 10 reps - Seated Horizontal Smooth Pursuit  - 1 x daily - 7 x weekly - 1 sets - 26 reps - Seated Gaze Stabilization with Head Rotation  - 1 x daily - 7 x weekly - 1 sets - 20 reps - Seated Gaze Stabilization with Head Nod  - 1 x daily - 7 x weekly - 1 sets - 20 reps - Brandt-Daroff Vestibular Exercise  - 1 x daily - 7 x weekly - 1 sets - 3 reps - Tandem Walking with Counter Support  - 1 x daily - 7 x weekly - 2 sets - 10 reps - Single Leg Stance with Support  - 1 x daily - 7 x weekly - 1 sets - 2 reps - 20 sec hold - Sit to Stand with Arms Crossed  - 1 x daily - 7 x weekly - 2 sets - 10 reps  Patient Education - BPPV   ASSESSMENT:  CLINICAL IMPRESSION:  Mr. Halley presents to skilled PT denying pain and reporting that he continues to have dizziness, but does state that he only gets it  at certain times now.  Patient reports that he does not feel as unsteady with ambulation now.  Patient continues to have nystagmus noted with [name] on left, so proceeded with canalith repositioning.  Performed canalith repositioning a third time today to assess if this would help his symptoms resolve better.  Patient continues to progress with standing balance and able to perform tandem gait without UE support.  OBJECTIVE IMPAIRMENTS: decreased activity tolerance, difficulty walking, decreased balance, decreased endurance, decreased mobility, decreased ROM, decreased strength, impaired flexibility, impaired UE/LE use, postural dysfunction, and pain.  ACTIVITY LIMITATIONS: bending, lifting, carry, locomotion, cleaning, community activity, driving, and or occupation  PERSONAL FACTORS: HTN, frequent falls are also affecting patient's functional outcome.  REHAB POTENTIAL: Good  CLINICAL DECISION MAKING: Stable/uncomplicated  EVALUATION COMPLEXITY: Moderate    GOALS: Short term PT Goals Target date: 09/13/2022  Pt will be I and compliant with HEP. Baseline:  Goal status: MET  Long term PT goals Target date: 10/11/2022 Pt will improve ankle strength to at least 5-/5 MMT to improve functional strength Baseline: Goal status: New  Pt will improve BERG by at least 3 points in order to demonstrate clinically significant improvement in balance.  Baseline:  48/56 Goal status: IN PROGRESS  Pt will reduce pain by overall 50% overall with usual activity Baseline: Goal status: IN PROGRESS  Pt will improve TUG by 3.4 seconds or Minimal clinically important difference.  Baseline:  16.06 sec  Goal status: MET on 09/28/2022  Pt will improve his STS by 2.3 seconds for Minimal clinically important difference.  Baseline:  24.64 sec Goal status: MET on 09/28/2022  Pt will be able to ambulate community distances at least 1000 ft WNL gait pattern without complaints Baseline: Goal status:  ONGOING         7. Pt will report no dizziness with positional changes.  Baseline: Goal status: ONGOING         8. Pt will deny any new falls throughout duration of PT.  Baseline: Goal status: ONGOING   PLAN: PT FREQUENCY: 1-2 times per week   PT DURATION: 6 weeks  PLANNED INTERVENTIONS (unless contraindicated): aquatic PT, Canalith repositioning, cryotherapy, Electrical stimulation, Iontophoresis with 4 mg/ml dexamethasome, Moist heat, traction, Ultrasound, gait training, Therapeutic exercise, balance training, neuromuscular re-education, patient/family education, prosthetic training, manual techniques, passive ROM, dry needling, taping, vasopnuematic device, vestibular, spinal manipulations, joint manipulations  PLAN FOR NEXT SESSION: Assess HEP/update PRN, vestibular/balance, strengthening, canalith repositioning as needed      Reather Laurence, PT 10/05/2022, 1:30 PM  [facility] [address], [address] [place], [place] [address] Phone # [phone] Fax [phone]

## 2022-10-10 ENCOUNTER — Encounter: Payer: Self-pay | Admitting: Rehabilitative and Restorative Service Providers"

## 2022-10-10 ENCOUNTER — Ambulatory Visit: Payer: Medicare HMO | Admitting: Rehabilitative and Restorative Service Providers"

## 2022-10-10 DIAGNOSIS — R2689 Other abnormalities of gait and mobility: Secondary | ICD-10-CM | POA: Diagnosis not present

## 2022-10-10 DIAGNOSIS — M6281 Muscle weakness (generalized): Secondary | ICD-10-CM | POA: Diagnosis not present

## 2022-10-10 DIAGNOSIS — M5459 Other low back pain: Secondary | ICD-10-CM

## 2022-10-10 DIAGNOSIS — R293 Abnormal posture: Secondary | ICD-10-CM

## 2022-10-10 DIAGNOSIS — R42 Dizziness and giddiness: Secondary | ICD-10-CM

## 2022-10-10 NOTE — Therapy (Signed)
OUTPATIENT PHYSICAL THERAPY VESTIBULAR TREATMENT NOTE AND REASSESSMENT   Patient Name: Kent Watts MRN: [id number] DOB:[birth year], 72 y.o., male Today's Date: 10/10/2022   Progress Note Reporting Period 08/30/2022 to 10/10/2022  See note below for Objective Data and Assessment of Progress/Goals.      END OF SESSION:  PT End of Session - 10/10/22 1233     Visit Number 10    Date for PT Re-Evaluation 12/02/22    Authorization Type [employer] MEDICARE    Progress Note Due on Visit 20    PT Start Time 1231    PT Stop Time 1310    PT Time Calculation (min) 39 min    Activity Tolerance Patient tolerated treatment well    Behavior During Therapy WFL for tasks assessed/performed             Past Medical History:  Diagnosis Date   Hypertension    Past Surgical History:  Procedure Laterality Date   KNEE SURGERY Right    There are no problems to display for this patient.   REFERRING PROVIDER: Roger Kill, PA-C   REFERRING DIAG: Repeated falls [R29.6], Personal history of fall [Z91.81], Other symptoms and signs involving the musculoskeletal system [R29.898], Other musculoskeletal symptoms referable to limbs(729.89) [R29.898]   THERAPY DIAG:  Other low back pain  Muscle weakness (generalized)  Other abnormalities of gait and mobility  Abnormal posture  Dizziness and giddiness  Rationale for Evaluation and Treatment: Rehabilitation  ONSET DATE: Over a year ago is when he started falling   SUBJECTIVE:   SUBJECTIVE STATEMENT: Pt reports that his dizziness improved from last visit.  States that he primarily only gets dizzy when he gets up from supine position.  States that his dizziness is at least 50% better.  PERTINENT HISTORY: HTN, recent cataract surgery PAIN:  Are you having pain? Yes: NPRS scale: 0-3/10 Pain location: general body ache Pain description: sharp  Aggravating factors: certain movements Relieving factors: CBD oil  PRECAUTIONS:  Fall  WEIGHT BEARING RESTRICTIONS: No  FALLS:  Has patient fallen in last 6 months? Yes. Number of falls 2  LIVING ENVIRONMENT: Lives with: lives with their spouse Lives in: House/apartment Stairs: Yes: External: 3 steps; none Has following equipment at home: Single point cane, Walker - 4 wheeled, and shower chair  OCCUPATION: Retired.   PLOF: Independent  PATIENT GOALS: Improve balance, strength and activity tolerance.   OBJECTIVE:   DIAGNOSTIC FINDINGS: 1. No acute fracture. 2. 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 3. Nonobstructive right renal calculus.  COGNITION: Overall cognitive status: Within functional limits for tasks assessed     SENSATION: WFL  POSTURE: rounded shoulders, forward head, and decreased lumbar lordosis  PALPATION: None to palpation.   LOWER EXTREMITY ROM:  Active ROM Right eval Left eval  Hip flexion [facility]  Hip abduction [facility]  Hip internal rotation [facility]  Hip external rotation [facility]  Knee flexion [facility] WFL  Knee extension 95%  WFL  Ankle dorsiflexion [facility] WFL  Ankle plantarflexion WFL WFL   (Blank rows = not tested)  LOWER EXTREMITY MMT:  MMT Right eval Left eval  Hip flexion 5 5  Hip extension 5 5  Hip abduction 4 4  Knee flexion 5 5  Knee extension 5 5  Ankle dorsiflexion 4+ 4+  Ankle inversion 4+ 5  Ankle eversion 4+ 5   (Blank rows = not tested)  FUNCTIONAL TESTS:  Eval: 5 times sit to stand: 24.64sec with reports of pain  in bilat knees.  Timed up and go (TUG): 16.06 sec   09/15/2022: 3 minute walk:  577 ft  09/28/2022: 5 times sit to stand: 12.4 sec Timed up and go (TUG): 7.32 sec   10/10/2022: Able to ambulate 1,000 ft in 4 min 28 sec.  Patient with some reports of soreness with ambulation.  GAIT: Distance walked: 55ft   Assistive device utilized: None Level of assistance: Complete Independence Comments: Decreased trunk rotation, decreased arm swing, decreased stance length.   BERG BALANCE  TEST Eval: Sitting to Standing: 4.      Stands without using hands and stabilize independently Standing Unsupported: 4.      Stands safely for 2 minutes Sitting Unsupported: 4.     Sits for 2 minutes independently Standing to Sitting: 4.     Sits safely with minimal use of hands Transfers: 4.     Transfers safely with minor use of hands Standing with eyes closed: 4.     Stands safely for 10 seconds  Standing with feet together: 4.     Stands for 1 minute safely Reaching forward with outstretched arm: 4.     Reaches forward 10 inches Retrieving object from the floor: 1.     Unable to pick up and needs supervision Turning to look behind: 3.     Looks behind one side only, other side less weight shift Turning 360 degrees: 3.     Able to turn on one side in </= 4 seconds Place alternate foot on stool: 3.     Completes 8 steps in >20 seconds Standing with one foot in front: 2.     Independent small step for 30 seconds Standing on one foot: 4.     Holds >10 seconds  Total Score: 48/56  10/10/2022: BERG 56/56  VESTIBULAR ASSESSMENT: 09/06/2022: Some decreased speed/slowness with tracking, especially towards left side Saccades are WFL Gaze stabilization is WFL Difficult to assess head thrust as pt had difficulty relaxing (but pt does report dizziness with fast head motions at times) [name] positive on each side with symptoms approx 10 sec    TODAY'S TREATMENT:                                                                                                                               DATE: 10/10/2022 Nustep level 5 x4 min with PT present to discuss status Able to ambulate 1,000 ft around PT gym in 4 min 28 sec BERG 56/56 Side stepping performing ball toss on wall 4x6 ft each direction Bouncing red pball around gym x50 ft, bouncing red pball backwards with mirror for visual guidance 3x10 ft, bouncing red pball around PT gym with alternate hands x50 ft Dix Hallpike positive on left  side, Epley Maneuver x1.  Dix hallpike on left negative after one session Reviewed Austin Miles x1 each direction, pt did not report dizziness Upon getting ready to leave session, pt had an  influx of dizziness and required a seated recovery before symptoms subsided.   DATE: 10/05/2022 Nustep level 5 x6 min with PT present to discuss status Sit to/from stand on pink foam 2x5 Standing on purple foam with eyes closed 2x20 sec Standing on purple foam performing head nods and head rotations x1 min each Ambulation down PT gym with head nods and head rotations x2 laps each Side stepping performing ball toss on wall 2x10 ft each direction Canalith repositioning with Epley on left side x3 with less symptoms noted on second trial and nearly no nystagmus noted on 3rd trial Tandem gait x12 ft without UE support   DATE: 10/03/2022 Nustep level 5 x6 min with PT present to discuss status Sit to/from stand on pink foam 2x5 Standing on purple foam with eyes closed 2x20 sec Standing on purple foam performing head nods and head rotations x1 min each Ambulation down PT gym with head nods and head rotations x2 laps each Canalith repositioning with Epley on left side x2 with less symptoms noted on second trial      PATIENT EDUCATION:  Education details: Issued HEP Person educated: Patient Education method: Explanation Education comprehension: verbalized understanding  HOME EXERCISE PROGRAM: Access Code: [email or web address] URL: [email or web address] Date: 09/21/2022 Prepared by: Clydie Braun Baneza Bartoszek  Exercises - Pencil Pushups  - 1 x daily - 7 x weekly - 2 sets - 10 reps - Seated Horizontal Smooth Pursuit  - 1 x daily - 7 x weekly - 1 sets - 26 reps - Seated Gaze Stabilization with Head Rotation  - 1 x daily - 7 x weekly - 1 sets - 20 reps - Seated Gaze Stabilization with Head Nod  - 1 x daily - 7 x weekly - 1 sets - 20 reps - Brandt-Daroff Vestibular Exercise  - 1 x daily - 7 x weekly - 1 sets - 3  reps - Tandem Walking with Counter Support  - 1 x daily - 7 x weekly - 2 sets - 10 reps - Single Leg Stance with Support  - 1 x daily - 7 x weekly - 1 sets - 2 reps - 20 sec hold - Sit to Stand with Arms Crossed  - 1 x daily - 7 x weekly - 2 sets - 10 reps  Patient Education - BPPV   ASSESSMENT:  CLINICAL IMPRESSION:  Mr. Deadmon presents to skilled PT stating that he has a general body ache with activity, but otherwise denies pain.  Patient did report that his dizziness seemed some improved.  Patient has improved with his BERG score and was able to safely ambulate 1,000 ft without loss of balance or dizziness reported.  Patient reports continued dizziness with change of position from lying to supine.  Patient continues with positive [name] on left side and continues to require canalith repositioning.  Reviewed Austin Miles for home maintenance.  Patient has not yet met vestibular goal and would benefit from continued skilled PT of 1-2x/week for 8 weeks to further progress towards goal related activities.  OBJECTIVE IMPAIRMENTS: decreased activity tolerance, difficulty walking, decreased balance, decreased endurance, decreased mobility, decreased ROM, decreased strength, impaired flexibility, impaired UE/LE use, postural dysfunction, and pain.  ACTIVITY LIMITATIONS: bending, lifting, carry, locomotion, cleaning, community activity, driving, and or occupation  PERSONAL FACTORS: HTN, frequent falls are also affecting patient's functional outcome.  REHAB POTENTIAL: Good  CLINICAL DECISION MAKING: Stable/uncomplicated  EVALUATION COMPLEXITY: Moderate    GOALS: Short term PT Goals Target date: 09/13/2022  Pt will be I  and compliant with HEP. Baseline:  Goal status: MET  Long term PT goals Target date: 12/02/2022 Pt will improve ankle strength to at least 5-/5 MMT to improve functional strength Baseline: Goal status: ONGOING  Pt will improve BERG by at least 3 points in order to  demonstrate clinically significant improvement in balance.  Baseline:  48/56 Goal status: MET on 10/10/2022  Pt will reduce pain by overall 50% overall with usual activity Baseline: Goal status: IN PROGRESS  Pt will improve TUG by 3.4 seconds or Minimal clinically important difference.  Baseline:  16.06 sec Goal status: MET on 09/28/2022  Pt will improve his STS by 2.3 seconds for Minimal clinically important difference.  Baseline:  24.64 sec Goal status: MET on 09/28/2022  Pt will be able to ambulate community distances at least 1000 ft WNL gait pattern without complaints Baseline: Goal status: PARTIALLY MET (still reported some pain on 10/10/2022)         7. Pt will report no dizziness with positional changes.  Baseline: Goal status: ONGOING         8. Pt will deny any new falls during 2 weeks leading to discharge.  Baseline: Goal status: ONGOING   PLAN: PT FREQUENCY: 1-2 times per week   PT DURATION: 8 weeks  PLANNED INTERVENTIONS (unless contraindicated): aquatic PT, Canalith repositioning, cryotherapy, Electrical stimulation, Iontophoresis with 4 mg/ml dexamethasome, Moist heat, traction, Ultrasound, gait training, Therapeutic exercise, balance training, neuromuscular re-education, patient/family education, manual techniques, passive ROM, dry needling, taping, vasopnuematic device, vestibular, spinal manipulations, joint manipulations  PLAN FOR NEXT SESSION: Assess HEP/update PRN, vestibular/balance, strengthening, canalith repositioning as needed      Reather Laurence, PT 10/10/2022, 1:30 PM  [facility] [address], [address] [place], [place] [address] Phone # [phone] Fax [phone]

## 2022-10-12 ENCOUNTER — Encounter: Payer: Self-pay | Admitting: Rehabilitative and Restorative Service Providers"

## 2022-10-12 ENCOUNTER — Ambulatory Visit: Payer: Medicare HMO | Admitting: Rehabilitative and Restorative Service Providers"

## 2022-10-12 DIAGNOSIS — R2689 Other abnormalities of gait and mobility: Secondary | ICD-10-CM | POA: Diagnosis not present

## 2022-10-12 DIAGNOSIS — M6281 Muscle weakness (generalized): Secondary | ICD-10-CM | POA: Diagnosis not present

## 2022-10-12 DIAGNOSIS — R42 Dizziness and giddiness: Secondary | ICD-10-CM | POA: Diagnosis not present

## 2022-10-12 DIAGNOSIS — M5459 Other low back pain: Secondary | ICD-10-CM

## 2022-10-12 DIAGNOSIS — R293 Abnormal posture: Secondary | ICD-10-CM | POA: Diagnosis not present

## 2022-10-12 NOTE — Therapy (Signed)
OUTPATIENT PHYSICAL THERAPY VESTIBULAR TREATMENT NOTE    Patient Name: Kent Watts MRN: [id number] DOB:30-Mar-1951, 72 y.o., male Today's Date: 10/12/2022    END OF SESSION:  PT End of Session - 10/12/22 1146     Visit Number 11    Date for PT Re-Evaluation 12/02/22    Authorization Type [employer] MEDICARE    Progress Note Due on Visit 20    PT Start Time 1145    PT Stop Time 1225    PT Time Calculation (min) 40 min    Activity Tolerance Patient tolerated treatment well    Behavior During Therapy WFL for tasks assessed/performed             Past Medical History:  Diagnosis Date   Hypertension    Past Surgical History:  Procedure Laterality Date   KNEE SURGERY Right    There are no problems to display for this patient.   REFERRING PROVIDER: Roger Kill, PA-C   REFERRING DIAG: Repeated falls [R29.6], Personal history of fall [Z91.81], Other symptoms and signs involving the musculoskeletal system [R29.898], Other musculoskeletal symptoms referable to limbs(729.89) [R29.898]   THERAPY DIAG:  Other low back pain  Muscle weakness (generalized)  Other abnormalities of gait and mobility  Abnormal posture  Dizziness and giddiness  Rationale for Evaluation and Treatment: Rehabilitation  ONSET DATE: Over a year ago is when he started falling   SUBJECTIVE:   SUBJECTIVE STATEMENT: Pt reports that he was really dizzy after last PT appointment, but is feeling back to his normal.  Patient continues to have dizziness with supine to sit.  PERTINENT HISTORY: HTN, recent cataract surgery PAIN:  Are you having pain? Yes: NPRS scale: 1/10 Pain location: general body ache Pain description: sharp  Aggravating factors: certain movements Relieving factors: CBD oil  PRECAUTIONS: Fall  WEIGHT BEARING RESTRICTIONS: No  FALLS:  Has patient fallen in last 6 months? Yes. Number of falls 2  LIVING ENVIRONMENT: Lives with: lives with their spouse Lives in:  House/apartment Stairs: Yes: External: 3 steps; none Has following equipment at home: Single point cane, Walker - 4 wheeled, and shower chair  OCCUPATION: Retired.   PLOF: Independent  PATIENT GOALS: Improve balance, strength and activity tolerance.   OBJECTIVE:   DIAGNOSTIC FINDINGS: 1. No acute fracture. 2. 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 3. Nonobstructive right renal calculus.  COGNITION: Overall cognitive status: Within functional limits for tasks assessed     SENSATION: WFL  POSTURE: rounded shoulders, forward head, and decreased lumbar lordosis  PALPATION: None to palpation.   LOWER EXTREMITY ROM:  Active ROM Right eval Left eval  Hip flexion [facility]  Hip abduction [facility]  Hip internal rotation [facility]  Hip external rotation [facility]  Knee flexion [facility] WFL  Knee extension 95%  WFL  Ankle dorsiflexion [facility] WFL  Ankle plantarflexion WFL WFL   (Blank rows = not tested)  LOWER EXTREMITY MMT:  MMT Right eval Left eval  Hip flexion 5 5  Hip extension 5 5  Hip abduction 4 4  Knee flexion 5 5  Knee extension 5 5  Ankle dorsiflexion 4+ 4+  Ankle inversion 4+ 5  Ankle eversion 4+ 5   (Blank rows = not tested)  FUNCTIONAL TESTS:  Eval: 5 times sit to stand: 24.64sec with reports of pain in bilat knees.  Timed up and go (TUG): 16.06 sec   09/15/2022: 3 minute walk:  577 ft  09/28/2022: 5 times sit to stand: 12.4 sec  Timed up and go (TUG): 7.32 sec   10/10/2022: Able to ambulate 1,000 ft in 4 min 28 sec.  Patient with some reports of soreness with ambulation.  GAIT: Distance walked: 42ft   Assistive device utilized: None Level of assistance: Complete Independence Comments: Decreased trunk rotation, decreased arm swing, decreased stance length.   BERG BALANCE TEST Eval: Sitting to Standing: 4.      Stands without using hands and stabilize independently Standing Unsupported: 4.      Stands safely for 2 minutes Sitting Unsupported: 4.      Sits for 2 minutes independently Standing to Sitting: 4.     Sits safely with minimal use of hands Transfers: 4.     Transfers safely with minor use of hands Standing with eyes closed: 4.     Stands safely for 10 seconds  Standing with feet together: 4.     Stands for 1 minute safely Reaching forward with outstretched arm: 4.     Reaches forward 10 inches Retrieving object from the floor: 1.     Unable to pick up and needs supervision Turning to look behind: 3.     Looks behind one side only, other side less weight shift Turning 360 degrees: 3.     Able to turn on one side in </= 4 seconds Place alternate foot on stool: 3.     Completes 8 steps in >20 seconds Standing with one foot in front: 2.     Independent small step for 30 seconds Standing on one foot: 4.     Holds >10 seconds  Total Score: 48/56  10/10/2022: BERG 56/56  VESTIBULAR ASSESSMENT: 09/06/2022: Some decreased speed/slowness with tracking, especially towards left side Saccades are WFL Gaze stabilization is WFL Difficult to assess head thrust as pt had difficulty relaxing (but pt does report dizziness with fast head motions at times) [name] positive on each side with symptoms approx 10 sec    TODAY'S TREATMENT:                                                                                                                               DATE: 10/12/2022 Nustep level 5 x6 min with PT present to discuss status Ambulation down PT gym hallway with head nods and head rotations x2 laps each Tandem gait and side stepping on AirEx Beam in parallel bars x4 laps each Standing sideways on AirEx beam performing head turns and nods x1 min each Standing sideways on AirEx beam with eyes closed 2x30 sec FWD step up onto bosu x10 bilat Side step up and over onto bosu x10 bilat Canalith repositioning with Epley on left side x3 with less symptoms noted on second trial and nearly no nystagmus noted on 3rd trial   DATE:  10/10/2022 Nustep level 5 x4 min with PT present to discuss status Able to ambulate 1,000 ft around PT gym in 4 min 28 sec BERG 56/56 Side stepping  performing ball toss on wall 4x6 ft each direction Bouncing red pball around gym x50 ft, bouncing red pball backwards with mirror for visual guidance 3x10 ft, bouncing red pball around PT gym with alternate hands x50 ft Dix Hallpike positive on left side, Epley Maneuver x1.  Dix hallpike on left negative after one session Reviewed Austin Miles x1 each direction, pt did not report dizziness Upon getting ready to leave session, pt had an influx of dizziness and required a seated recovery before symptoms subsided.   DATE: 10/05/2022 Nustep level 5 x6 min with PT present to discuss status Sit to/from stand on pink foam 2x5 Standing on purple foam with eyes closed 2x20 sec Standing on purple foam performing head nods and head rotations x1 min each Ambulation down PT gym with head nods and head rotations x2 laps each Side stepping performing ball toss on wall 2x10 ft each direction Canalith repositioning with Epley on left side x3 with less symptoms noted on second trial and nearly no nystagmus noted on 3rd trial Tandem gait x12 ft without UE support     PATIENT EDUCATION:  Education details: Issued HEP Person educated: Patient Education method: Explanation Education comprehension: verbalized understanding  HOME EXERCISE PROGRAM: Access Code: [email or web address] URL: [email or web address] Date: 10/12/2022 Prepared by: Clydie Braun Maribel Hadley  Exercises - Pencil Pushups  - 1 x daily - 7 x weekly - 2 sets - 10 reps - Seated Horizontal Smooth Pursuit  - 1 x daily - 7 x weekly - 1 sets - 26 reps - Seated Gaze Stabilization with Head Rotation  - 1 x daily - 7 x weekly - 1 sets - 20 reps - Seated Gaze Stabilization with Head Nod  - 1 x daily - 7 x weekly - 1 sets - 20 reps - Brandt-Daroff Vestibular Exercise  - 1 x daily - 7 x weekly - 1 sets - 3  reps - Tandem Walking with Counter Support  - 1 x daily - 7 x weekly - 2 sets - 10 reps - Single Leg Stance with Support  - 1 x daily - 7 x weekly - 1 sets - 2 reps - 20 sec hold - Sit to Stand with Arms Crossed  - 1 x daily - 7 x weekly - 2 sets - 10 reps - Self-Epley Maneuver Left Ear  - 1 x daily - 7 x weekly - 1 sets - 2 reps  Patient Education - BPPV   ASSESSMENT:  CLINICAL IMPRESSION:  Mr. Rauschenberger presents to skilled PT reporting that he had some increased dizziness following last appointment, but he is feeling better now.  Patient reported that he felt best after doing Epley Maneuver 3x, so proceeded with performing canalith repositioning x3 attempts today.  Patient without any dizziness or nystagmus on 3rd positioning.  Patient continues to progress with vestibular rehab and improved standing balance.  Provided patient with self-Epley maneuver for home performance given he has a week off of PT appointments next week to assess if he can make better progress towards decreased dizziness.  OBJECTIVE IMPAIRMENTS: decreased activity tolerance, difficulty walking, decreased balance, decreased endurance, decreased mobility, decreased ROM, decreased strength, impaired flexibility, impaired UE/LE use, postural dysfunction, and pain.  ACTIVITY LIMITATIONS: bending, lifting, carry, locomotion, cleaning, community activity, driving, and or occupation  PERSONAL FACTORS: HTN, frequent falls are also affecting patient's functional outcome.  REHAB POTENTIAL: Good  CLINICAL DECISION MAKING: Stable/uncomplicated  EVALUATION COMPLEXITY: Moderate    GOALS: Short term PT Goals Target date: 09/13/2022  Pt  will be I and compliant with HEP. Baseline:  Goal status: MET  Long term PT goals Target date: 12/02/2022 Pt will improve ankle strength to at least 5-/5 MMT to improve functional strength Baseline: Goal status: ONGOING  Pt will improve BERG by at least 3 points in order to demonstrate  clinically significant improvement in balance.  Baseline:  48/56 Goal status: MET on 10/10/2022  Pt will reduce pain by overall 50% overall with usual activity Baseline: Goal status: IN PROGRESS  Pt will improve TUG by 3.4 seconds or Minimal clinically important difference.  Baseline:  16.06 sec Goal status: MET on 09/28/2022  Pt will improve his STS by 2.3 seconds for Minimal clinically important difference.  Baseline:  24.64 sec Goal status: MET on 09/28/2022  Pt will be able to ambulate community distances at least 1000 ft WNL gait pattern without complaints Baseline: Goal status: PARTIALLY MET (still reported some pain on 10/10/2022)         7. Pt will report no dizziness with positional changes.  Baseline: Goal status: ONGOING         8. Pt will deny any new falls during 2 weeks leading to discharge.  Baseline: Goal status: ONGOING   PLAN: PT FREQUENCY: 1-2 times per week   PT DURATION: 8 weeks  PLANNED INTERVENTIONS (unless contraindicated): aquatic PT, Canalith repositioning, cryotherapy, Electrical stimulation, Iontophoresis with 4 mg/ml dexamethasome, Moist heat, traction, Ultrasound, gait training, Therapeutic exercise, balance training, neuromuscular re-education, patient/family education, manual techniques, passive ROM, dry needling, taping, vasopnuematic device, vestibular, spinal manipulations, joint manipulations  PLAN FOR NEXT SESSION: Assess HEP/update PRN, vestibular/balance, strengthening, canalith repositioning as needed      Reather Laurence, PT 10/12/2022, 12:36 PM  [facility] [address], [address] [place], [place] [address] Phone # [phone] Fax [phone]

## 2022-10-24 ENCOUNTER — Ambulatory Visit: Payer: Medicare HMO | Admitting: Physical Therapy

## 2022-10-24 DIAGNOSIS — M6281 Muscle weakness (generalized): Secondary | ICD-10-CM | POA: Diagnosis not present

## 2022-10-24 DIAGNOSIS — R42 Dizziness and giddiness: Secondary | ICD-10-CM

## 2022-10-24 DIAGNOSIS — M5459 Other low back pain: Secondary | ICD-10-CM

## 2022-10-24 DIAGNOSIS — R293 Abnormal posture: Secondary | ICD-10-CM | POA: Diagnosis not present

## 2022-10-24 DIAGNOSIS — R2689 Other abnormalities of gait and mobility: Secondary | ICD-10-CM | POA: Diagnosis not present

## 2022-10-24 NOTE — Therapy (Signed)
OUTPATIENT PHYSICAL THERAPY VESTIBULAR TREATMENT NOTE    Patient Name: Kent Watts MRN: [id number] DOB:[birth year], 72 y.o., male Today's Date: 10/24/2022    END OF SESSION:  PT End of Session - 10/24/22 1147     Visit Number 12    Date for PT Re-Evaluation 12/02/22    Authorization Type [employer] MEDICARE    Progress Note Due on Visit 20    PT Start Time 1147    PT Stop Time 1230    PT Time Calculation (min) 43 min    Activity Tolerance Patient tolerated treatment well    Behavior During Therapy WFL for tasks assessed/performed              Past Medical History:  Diagnosis Date   Hypertension    Past Surgical History:  Procedure Laterality Date   KNEE SURGERY Right    There are no problems to display for this patient.   REFERRING PROVIDER: Roger Kill, PA-C   REFERRING DIAG: Repeated falls [R29.6], Personal history of fall [Z91.81], Other symptoms and signs involving the musculoskeletal system [R29.898], Other musculoskeletal symptoms referable to limbs(729.89) [R29.898]   THERAPY DIAG:  Other low back pain  Muscle weakness (generalized)  Other abnormalities of gait and mobility  Abnormal posture  Dizziness and giddiness  Rationale for Evaluation and Treatment: Rehabilitation  ONSET DATE: Over a year ago is when he started falling   SUBJECTIVE:   SUBJECTIVE STATEMENT: Pt reports dizziness has been better.   PERTINENT HISTORY: HTN, recent cataract surgery PAIN:  Are you having pain? Yes: NPRS scale: 1/10 Pain location: general body ache Pain description: sharp  Aggravating factors: certain movements Relieving factors: CBD oil  PRECAUTIONS: Fall  WEIGHT BEARING RESTRICTIONS: No  FALLS:  Has patient fallen in last 6 months? Yes. Number of falls 2  LIVING ENVIRONMENT: Lives with: lives with their spouse Lives in: House/apartment Stairs: Yes: External: 3 steps; none Has following equipment at home: Single point cane, Walker - 4  wheeled, and shower chair  OCCUPATION: Retired.   PLOF: Independent  PATIENT GOALS: Improve balance, strength and activity tolerance.   OBJECTIVE:   DIAGNOSTIC FINDINGS: 1. No acute fracture. 2. 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 3. Nonobstructive right renal calculus.  COGNITION: Overall cognitive status: Within functional limits for tasks assessed     SENSATION: WFL  POSTURE: rounded shoulders, forward head, and decreased lumbar lordosis  PALPATION: None to palpation.   LOWER EXTREMITY ROM:  Active ROM Right eval Left eval  Hip flexion [facility]  Hip abduction [facility]  Hip internal rotation [facility]  Hip external rotation [facility]  Knee flexion [facility] WFL  Knee extension 95%  WFL  Ankle dorsiflexion [facility] WFL  Ankle plantarflexion WFL WFL   (Blank rows = not tested)  LOWER EXTREMITY MMT:  MMT Right eval Left eval  Hip flexion 5 5  Hip extension 5 5  Hip abduction 4 4  Knee flexion 5 5  Knee extension 5 5  Ankle dorsiflexion 4+ 4+  Ankle inversion 4+ 5  Ankle eversion 4+ 5   (Blank rows = not tested)  FUNCTIONAL TESTS:  Eval: 5 times sit to stand: 24.64sec with reports of pain in bilat knees.  Timed up and go (TUG): 16.06 sec   09/15/2022: 3 minute walk:  577 ft  09/28/2022: 5 times sit to stand: 12.4 sec Timed up and go (TUG): 7.32 sec   10/10/2022: Able to ambulate 1,000 ft in 4 min 28 sec.  Patient with some reports of soreness with ambulation.  GAIT: Distance walked: 65ft   Assistive device utilized: None Level of assistance: Complete Independence Comments: Decreased trunk rotation, decreased arm swing, decreased stance length.   BERG BALANCE TEST Eval: Sitting to Standing: 4.      Stands without using hands and stabilize independently Standing Unsupported: 4.      Stands safely for 2 minutes Sitting Unsupported: 4.     Sits for 2 minutes independently Standing to Sitting: 4.     Sits safely with minimal use of hands Transfers: 4.      Transfers safely with minor use of hands Standing with eyes closed: 4.     Stands safely for 10 seconds  Standing with feet together: 4.     Stands for 1 minute safely Reaching forward with outstretched arm: 4.     Reaches forward 10 inches Retrieving object from the floor: 1.     Unable to pick up and needs supervision Turning to look behind: 3.     Looks behind one side only, other side less weight shift Turning 360 degrees: 3.     Able to turn on one side in </= 4 seconds Place alternate foot on stool: 3.     Completes 8 steps in >20 seconds Standing with one foot in front: 2.     Independent small step for 30 seconds Standing on one foot: 4.     Holds >10 seconds  Total Score: 48/56  10/10/2022: BERG 56/56  VESTIBULAR ASSESSMENT: 09/06/2022: Some decreased speed/slowness with tracking, especially towards left side Saccades are WFL Gaze stabilization is WFL Difficult to assess head thrust as pt had difficulty relaxing (but pt does report dizziness with fast head motions at times) [name] positive on each side with symptoms approx 10 sec    TODAY'S TREATMENT:                                                                                                                              DATE: 10/24/2022 Nustep level 5 x 5 min Standing VOR x1 horizontal and vertical 2x30 sec Standing smooth pursuit horizontal and vertical 2x30 sec Standing on pink airex feet apart eyes closed head turns 2x30 sec, head nods 2x30 sec Standing on pink airex, foot tap forward 2x10, foot tap to the L &R 2x10 Ambulation down PT gym hallway with head nods and head rotations x2 laps each Self Epley maneuver x3 Vestibular ball x10 CW & then CCW x10   DATE: 10/12/2022 Nustep level 5 x6 min with PT present to discuss status Ambulation down PT gym hallway with head nods and head rotations x2 laps each Tandem gait and side stepping on AirEx Beam in parallel bars x4 laps each Standing sideways on AirEx beam  performing head turns and nods x1 min each Standing sideways on AirEx beam with eyes closed 2x30 sec FWD step up onto bosu x10 bilat Side step up  and over onto bosu x10 bilat Canalith repositioning with Epley on left side x3 with less symptoms noted on second trial and nearly no nystagmus noted on 3rd trial   DATE: 10/10/2022 Nustep level 5 x4 min with PT present to discuss status Able to ambulate 1,000 ft around PT gym in 4 min 28 sec BERG 56/56 Side stepping performing ball toss on wall 4x6 ft each direction Bouncing red pball around gym x50 ft, bouncing red pball backwards with mirror for visual guidance 3x10 ft, bouncing red pball around PT gym with alternate hands x50 ft Dix Hallpike positive on left side, Epley Maneuver x1.  Dix hallpike on left negative after one session Reviewed Austin Miles x1 each direction, pt did not report dizziness Upon getting ready to leave session, pt had an influx of dizziness and required a seated recovery before symptoms subsided.   DATE: 10/05/2022 Nustep level 5 x6 min with PT present to discuss status Sit to/from stand on pink foam 2x5 Standing on purple foam with eyes closed 2x20 sec Standing on purple foam performing head nods and head rotations x1 min each Ambulation down PT gym with head nods and head rotations x2 laps each Side stepping performing ball toss on wall 2x10 ft each direction Canalith repositioning with Epley on left side x3 with less symptoms noted on second trial and nearly no nystagmus noted on 3rd trial Tandem gait x12 ft without UE support     PATIENT EDUCATION:  Education details: Issued HEP Person educated: Patient Education method: Explanation Education comprehension: verbalized understanding  HOME EXERCISE PROGRAM: Access Code: [email or web address] URL: [email or web address] Date: 10/12/2022 Prepared by: Clydie Braun Menke  Exercises - Pencil Pushups  - 1 x daily - 7 x weekly - 2 sets - 10 reps - Seated  Horizontal Smooth Pursuit  - 1 x daily - 7 x weekly - 1 sets - 26 reps - Seated Gaze Stabilization with Head Rotation  - 1 x daily - 7 x weekly - 1 sets - 20 reps - Seated Gaze Stabilization with Head Nod  - 1 x daily - 7 x weekly - 1 sets - 20 reps - Brandt-Daroff Vestibular Exercise  - 1 x daily - 7 x weekly - 1 sets - 3 reps - Tandem Walking with Counter Support  - 1 x daily - 7 x weekly - 2 sets - 10 reps - Single Leg Stance with Support  - 1 x daily - 7 x weekly - 1 sets - 2 reps - 20 sec hold - Sit to Stand with Arms Crossed  - 1 x daily - 7 x weekly - 2 sets - 10 reps - Self-Epley Maneuver Left Ear  - 1 x daily - 7 x weekly - 1 sets - 2 reps  Patient Education - BPPV   ASSESSMENT:  CLINICAL IMPRESSION:  Reviewed self Epley maneuver. No nystagmus noted today; however, pt had requested to do 3x. Continued to work on standing balance and progressing gaze stabilization. Remains slightly guarded with head movements.   OBJECTIVE IMPAIRMENTS: decreased activity tolerance, difficulty walking, decreased balance, decreased endurance, decreased mobility, decreased ROM, decreased strength, impaired flexibility, impaired UE/LE use, postural dysfunction, and pain.  ACTIVITY LIMITATIONS: bending, lifting, carry, locomotion, cleaning, community activity, driving, and or occupation  PERSONAL FACTORS: HTN, frequent falls are also affecting patient's functional outcome.  REHAB POTENTIAL: Good  CLINICAL DECISION MAKING: Stable/uncomplicated  EVALUATION COMPLEXITY: Moderate    GOALS: Short term PT Goals Target date: 09/13/2022  Pt will be I  and compliant with HEP. Baseline:  Goal status: MET  Long term PT goals Target date: 12/02/2022 Pt will improve ankle strength to at least 5-/5 MMT to improve functional strength Baseline: Goal status: ONGOING  Pt will improve BERG by at least 3 points in order to demonstrate clinically significant improvement in balance.  Baseline:  48/56 Goal  status: MET on 10/10/2022  Pt will reduce pain by overall 50% overall with usual activity Baseline: Goal status: IN PROGRESS  Pt will improve TUG by 3.4 seconds or Minimal clinically important difference.  Baseline:  16.06 sec Goal status: MET on 09/28/2022  Pt will improve his STS by 2.3 seconds for Minimal clinically important difference.  Baseline:  24.64 sec Goal status: MET on 09/28/2022  Pt will be able to ambulate community distances at least 1000 ft WNL gait pattern without complaints Baseline: Goal status: PARTIALLY MET (still reported some pain on 10/10/2022)         7. Pt will report no dizziness with positional changes.  Baseline: Goal status: ONGOING         8. Pt will deny any new falls during 2 weeks leading to discharge.  Baseline: Goal status: ONGOING   PLAN: PT FREQUENCY: 1-2 times per week   PT DURATION: 8 weeks  PLANNED INTERVENTIONS (unless contraindicated): aquatic PT, Canalith repositioning, cryotherapy, Electrical stimulation, Iontophoresis with 4 mg/ml dexamethasome, Moist heat, traction, Ultrasound, gait training, Therapeutic exercise, balance training, neuromuscular re-education, patient/family education, manual techniques, passive ROM, dry needling, taping, vasopnuematic device, vestibular, spinal manipulations, joint manipulations  PLAN FOR NEXT SESSION: Assess HEP/update PRN, vestibular/balance, strengthening, canalith repositioning as needed      [facility] April Ma L Messiah Rovira, PT 10/24/2022, 11:47 AM  [facility] [address] [place], [place] [address] Phone # [phone] Fax [phone]

## 2022-10-28 DIAGNOSIS — X58XXXA Exposure to other specified factors, initial encounter: Secondary | ICD-10-CM | POA: Diagnosis not present

## 2022-10-28 DIAGNOSIS — S63502A Unspecified sprain of left wrist, initial encounter: Secondary | ICD-10-CM | POA: Diagnosis not present

## 2022-11-07 ENCOUNTER — Ambulatory Visit: Payer: Medicare HMO | Attending: Physician Assistant | Admitting: Rehabilitative and Restorative Service Providers"

## 2022-11-07 ENCOUNTER — Encounter: Payer: Self-pay | Admitting: Rehabilitative and Restorative Service Providers"

## 2022-11-07 DIAGNOSIS — R293 Abnormal posture: Secondary | ICD-10-CM

## 2022-11-07 DIAGNOSIS — M6281 Muscle weakness (generalized): Secondary | ICD-10-CM

## 2022-11-07 DIAGNOSIS — R2689 Other abnormalities of gait and mobility: Secondary | ICD-10-CM

## 2022-11-07 DIAGNOSIS — M5459 Other low back pain: Secondary | ICD-10-CM

## 2022-11-07 DIAGNOSIS — R42 Dizziness and giddiness: Secondary | ICD-10-CM

## 2022-11-07 NOTE — Therapy (Signed)
OUTPATIENT PHYSICAL THERAPY VESTIBULAR TREATMENT NOTE    Patient Name: Irl Aberg MRN: [id number] DOB:[birth year], 72 y.o., male Today's Date: 11/07/2022    END OF SESSION:  PT End of Session - 11/07/22 1239     Visit Number 13    Date for PT Re-Evaluation 12/02/22    Authorization Type [employer] MEDICARE    Progress Note Due on Visit 20    PT Start Time 1237    PT Stop Time 1315    PT Time Calculation (min) 38 min    Activity Tolerance Patient tolerated treatment well    Behavior During Therapy WFL for tasks assessed/performed              Past Medical History:  Diagnosis Date   Hypertension    Past Surgical History:  Procedure Laterality Date   KNEE SURGERY Right    There are no problems to display for this patient.   REFERRING PROVIDER: Roger Kill, PA-C   REFERRING DIAG: Repeated falls [R29.6], Personal history of fall [Z91.81], Other symptoms and signs involving the musculoskeletal system [R29.898], Other musculoskeletal symptoms referable to limbs(729.89) [R29.898]   THERAPY DIAG:  Other low back pain  Muscle weakness (generalized)  Other abnormalities of gait and mobility  Abnormal posture  Dizziness and giddiness  Rationale for Evaluation and Treatment: Rehabilitation  ONSET DATE: Over a year ago is when he started falling   SUBJECTIVE:   SUBJECTIVE STATEMENT: Pt sustained a fall on 10/28/2022 when he was using a pallet jack on a concrete ramp. The pallet jack began to roll towards him on the ramp and it caused him to fall backwards and he hyperextended his left wrist in the process.  Patient continues to deny dizziness.  PERTINENT HISTORY: HTN, recent cataract surgery PAIN:  Are you having pain? Yes: NPRS scale: 5/10 Pain location: general body ache Pain description: sharp  Aggravating factors: certain movements Relieving factors: CBD oil  PRECAUTIONS: Fall  WEIGHT BEARING RESTRICTIONS: No  FALLS:  Has patient fallen in  last 6 months? Yes. Number of falls 2  LIVING ENVIRONMENT: Lives with: lives with their spouse Lives in: House/apartment Stairs: Yes: External: 3 steps; none Has following equipment at home: Single point cane, Walker - 4 wheeled, and shower chair  OCCUPATION: Retired.   PLOF: Independent  PATIENT GOALS: Improve balance, strength and activity tolerance.   OBJECTIVE:   DIAGNOSTIC FINDINGS: 1. No acute fracture. 2. 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 3. Nonobstructive right renal calculus.   Left Wrist Radiograph on 10/28/2022: IMPRESSION: 1. No acute fracture. 2. No malalignment. 3. 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   COGNITION: Overall cognitive status: Within functional limits for tasks assessed     SENSATION: WFL  POSTURE: rounded shoulders, forward head, and decreased lumbar lordosis  PALPATION: None to palpation.   LOWER EXTREMITY ROM:  Active ROM Right eval Left eval  Hip flexion [facility]  Hip abduction [facility]  Hip internal rotation [facility]  Hip external rotation [facility]  Knee flexion [facility]  Knee extension 95%  WFL  Ankle dorsiflexion [facility] WFL  Ankle plantarflexion WFL WFL   (Blank rows = not tested)  LOWER EXTREMITY MMT:  MMT Right eval Left eval  Hip flexion 5 5  Hip extension 5 5  Hip abduction 4 4  Knee flexion 5 5  Knee extension 5 5  Ankle dorsiflexion 4+ 4+  Ankle inversion 4+  5  Ankle eversion 4+ 5   (Blank rows = not tested)  FUNCTIONAL TESTS:  Eval: 5 times sit to stand: 24.64sec with reports of pain in bilat knees.  Timed up and go (TUG): 16.06 sec   09/15/2022: 3 minute walk:  577 ft  09/28/2022: 5 times sit to stand: 12.4 sec Timed up and go (TUG): 7.32 sec   10/10/2022: Able to ambulate 1,000 ft in 4 min 28 sec.  Patient with some reports of soreness with ambulation.  GAIT: Distance walked: 67ft   Assistive device utilized:  None Level of assistance: Complete Independence Comments: Decreased trunk rotation, decreased arm swing, decreased stance length.   BERG BALANCE TEST Eval: Sitting to Standing: 4.      Stands without using hands and stabilize independently Standing Unsupported: 4.      Stands safely for 2 minutes Sitting Unsupported: 4.     Sits for 2 minutes independently Standing to Sitting: 4.     Sits safely with minimal use of hands Transfers: 4.     Transfers safely with minor use of hands Standing with eyes closed: 4.     Stands safely for 10 seconds  Standing with feet together: 4.     Stands for 1 minute safely Reaching forward with outstretched arm: 4.     Reaches forward 10 inches Retrieving object from the floor: 1.     Unable to pick up and needs supervision Turning to look behind: 3.     Looks behind one side only, other side less weight shift Turning 360 degrees: 3.     Able to turn on one side in </= 4 seconds Place alternate foot on stool: 3.     Completes 8 steps in >20 seconds Standing with one foot in front: 2.     Independent small step for 30 seconds Standing on one foot: 4.     Holds >10 seconds  Total Score: 48/56  10/10/2022: BERG 56/56  VESTIBULAR ASSESSMENT: 09/06/2022: Some decreased speed/slowness with tracking, especially towards left side Saccades are WFL Gaze stabilization is WFL Difficult to assess head thrust as pt had difficulty relaxing (but pt does report dizziness with fast head motions at times) [name] positive on each side with symptoms approx 10 sec    TODAY'S TREATMENT:                                                                                                                               DATE: 11/07/2022 Nustep level 5 x6 min with PT present to discuss status Ambulation down PT gym hallway with head nods and head rotations x2 laps each Sitting on blue pball:  bouncing with head turns and head nods 2x1 min each Sitting on blue pball:  marching  and LAQ 2x10 bilat (with PT holding ball for improved balance) Tandem gait 4x10 ft Marching on trampoline 2x1 min with UE support as needed Retrogait on treadmill -0.4 mph  x3 min with UE support   DATE: 10/24/2022 Nustep level 5 x 5 min Standing VOR x1 horizontal and vertical 2x30 sec Standing smooth pursuit horizontal and vertical 2x30 sec Standing on pink airex feet apart eyes closed head turns 2x30 sec, head nods 2x30 sec Standing on pink airex, foot tap forward 2x10, foot tap to the L &R 2x10 Ambulation down PT gym hallway with head nods and head rotations x2 laps each Self Epley maneuver x3 Vestibular ball x10 CW & then CCW x10   DATE: 10/12/2022 Nustep level 5 x6 min with PT present to discuss status Ambulation down PT gym hallway with head nods and head rotations x2 laps each Tandem gait and side stepping on AirEx Beam in parallel bars x4 laps each Standing sideways on AirEx beam performing head turns and nods x1 min each Standing sideways on AirEx beam with eyes closed 2x30 sec FWD step up onto bosu x10 bilat Side step up and over onto bosu x10 bilat Canalith repositioning with Epley on left side x3 with less symptoms noted on second trial and nearly no nystagmus noted on 3rd trial      PATIENT EDUCATION:  Education details: Issued HEP Person educated: Patient Education method: Explanation Education comprehension: verbalized understanding  HOME EXERCISE PROGRAM: Access Code: [email or web address] URL: [email or web address] Date: 10/12/2022 Prepared by: Clydie Braun Braylee Lal  Exercises - Pencil Pushups  - 1 x daily - 7 x weekly - 2 sets - 10 reps - Seated Horizontal Smooth Pursuit  - 1 x daily - 7 x weekly - 1 sets - 26 reps - Seated Gaze Stabilization with Head Rotation  - 1 x daily - 7 x weekly - 1 sets - 20 reps - Seated Gaze Stabilization with Head Nod  - 1 x daily - 7 x weekly - 1 sets - 20 reps - Brandt-Daroff Vestibular Exercise  - 1 x daily - 7 x weekly - 1 sets -  3 reps - Tandem Walking with Counter Support  - 1 x daily - 7 x weekly - 2 sets - 10 reps - Single Leg Stance with Support  - 1 x daily - 7 x weekly - 1 sets - 2 reps - 20 sec hold - Sit to Stand with Arms Crossed  - 1 x daily - 7 x weekly - 2 sets - 10 reps - Self-Epley Maneuver Left Ear  - 1 x daily - 7 x weekly - 1 sets - 2 reps  Patient Education - BPPV   ASSESSMENT:  CLINICAL IMPRESSION:  Mr Berland presents to skilled PT with a left wrist brace in place.  Patient denies any dizziness, but states that he was trying to move heavy pallets and ended up getting pushed down and hyperextended his left wrist.  Patient reports various abrasions from sliding down concrete and reports bruising on his feet.  Patient able to progress with vestibular rehab with some modifications secondary to injuries sustained from getting knocked down/fall with the heavy load of pallets.  Patient continues to progress towards skilled PT and decreased overall fall risk.  OBJECTIVE IMPAIRMENTS: decreased activity tolerance, difficulty walking, decreased balance, decreased endurance, decreased mobility, decreased ROM, decreased strength, impaired flexibility, impaired UE/LE use, postural dysfunction, and pain.  ACTIVITY LIMITATIONS: bending, lifting, carry, locomotion, cleaning, community activity, driving, and or occupation  PERSONAL FACTORS: HTN, frequent falls are also affecting patient's functional outcome.  REHAB POTENTIAL: Good  CLINICAL DECISION MAKING: Stable/uncomplicated  EVALUATION COMPLEXITY: Moderate    GOALS: Short term PT Goals  Target date: 09/13/2022  Pt will be I and compliant with HEP. Baseline:  Goal status: MET  Long term PT goals Target date: 12/02/2022 Pt will improve ankle strength to at least 5-/5 MMT to improve functional strength Baseline: Goal status: ONGOING  Pt will improve BERG by at least 3 points in order to demonstrate clinically significant improvement in balance.   Baseline:  48/56 Goal status: MET on 10/10/2022  Pt will reduce pain by overall 50% overall with usual activity Baseline: Goal status: IN PROGRESS  Pt will improve TUG by 3.4 seconds or Minimal clinically important difference.  Baseline:  16.06 sec Goal status: MET on 09/28/2022  Pt will improve his STS by 2.3 seconds for Minimal clinically important difference.  Baseline:  24.64 sec Goal status: MET on 09/28/2022  Pt will be able to ambulate community distances at least 1000 ft WNL gait pattern without complaints Baseline: Goal status: PARTIALLY MET (still reported some pain on 10/10/2022)         7. Pt will report no dizziness with positional changes.  Baseline: Goal status: ONGOING         8. Pt will deny any new falls during 2 weeks leading to discharge.  Baseline: Goal status: ONGOING   PLAN: PT FREQUENCY: 1-2 times per week   PT DURATION: 8 weeks  PLANNED INTERVENTIONS (unless contraindicated): aquatic PT, Canalith repositioning, cryotherapy, Electrical stimulation, Iontophoresis with 4 mg/ml dexamethasome, Moist heat, traction, Ultrasound, gait training, Therapeutic exercise, balance training, neuromuscular re-education, patient/family education, manual techniques, passive ROM, dry needling, taping, vasopnuematic device, vestibular, spinal manipulations, joint manipulations  PLAN FOR NEXT SESSION: Assess HEP/update PRN, vestibular/balance, strengthening, canalith repositioning as needed      Hadlei Stitt, PT 11/07/2022, 1:27 PM  [facility] [address], [address] [place], [place] [address] Phone # [phone] Fax [phone]

## 2022-11-09 ENCOUNTER — Ambulatory Visit: Payer: Medicare HMO | Admitting: Rehabilitative and Restorative Service Providers"

## 2022-11-09 ENCOUNTER — Encounter: Payer: Self-pay | Admitting: Rehabilitative and Restorative Service Providers"

## 2022-11-09 DIAGNOSIS — R2689 Other abnormalities of gait and mobility: Secondary | ICD-10-CM

## 2022-11-09 DIAGNOSIS — M5459 Other low back pain: Secondary | ICD-10-CM | POA: Diagnosis not present

## 2022-11-09 DIAGNOSIS — R42 Dizziness and giddiness: Secondary | ICD-10-CM | POA: Diagnosis not present

## 2022-11-09 DIAGNOSIS — R293 Abnormal posture: Secondary | ICD-10-CM

## 2022-11-09 DIAGNOSIS — M6281 Muscle weakness (generalized): Secondary | ICD-10-CM

## 2022-11-09 NOTE — Therapy (Signed)
OUTPATIENT PHYSICAL THERAPY VESTIBULAR TREATMENT NOTE    Patient Name: Kent Watts MRN: [id number] DOB:10-Nov-1950, 72 y.o., male Today's Date: 11/09/2022    END OF SESSION:  PT End of Session - 11/09/22 1102     Visit Number 14    Date for PT Re-Evaluation 12/02/22    Authorization Type [employer] MEDICARE    Progress Note Due on Visit 20    PT Start Time 1059    PT Stop Time 1140    PT Time Calculation (min) 41 min    Activity Tolerance Patient tolerated treatment well    Behavior During Therapy WFL for tasks assessed/performed              Past Medical History:  Diagnosis Date   Hypertension    Past Surgical History:  Procedure Laterality Date   KNEE SURGERY Right    There are no problems to display for this patient.   REFERRING PROVIDER: Roger Kill, PA-C   REFERRING DIAG: Repeated falls [R29.6], Personal history of fall [Z91.81], Other symptoms and signs involving the musculoskeletal system [R29.898], Other musculoskeletal symptoms referable to limbs(729.89) [R29.898]   THERAPY DIAG:  Other low back pain  Muscle weakness (generalized)  Other abnormalities of gait and mobility  Abnormal posture  Dizziness and giddiness  Rationale for Evaluation and Treatment: Rehabilitation  ONSET DATE: Over a year ago is when he started falling   SUBJECTIVE:   SUBJECTIVE STATEMENT: Pt reports that he was a little fatigued after last session, but denies increased pain after session.  Patient states that he had some pain in his neck yesterday, but is feeling better today.    PERTINENT HISTORY: HTN, recent cataract surgery PAIN:  Are you having pain? Yes: NPRS scale: 0-5/10 Pain location: general body ache Pain description: sharp  Aggravating factors: certain movements Relieving factors: CBD oil  PRECAUTIONS: Fall  WEIGHT BEARING RESTRICTIONS: No  FALLS:  Has patient fallen in last 6 months? Yes. Number of falls 2  LIVING ENVIRONMENT: Lives with:  lives with their spouse Lives in: House/apartment Stairs: Yes: External: 3 steps; none Has following equipment at home: Single point cane, Walker - 4 wheeled, and shower chair  OCCUPATION: Retired.   PLOF: Independent  PATIENT GOALS: Improve balance, strength and activity tolerance.   OBJECTIVE:   DIAGNOSTIC FINDINGS: 1. No acute fracture. 2. 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 3. Nonobstructive right renal calculus.   Left Wrist Radiograph on 10/28/2022: IMPRESSION: 1. No acute fracture. 2. No malalignment. 3. 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   COGNITION: Overall cognitive status: Within functional limits for tasks assessed     SENSATION: WFL  POSTURE: rounded shoulders, forward head, and decreased lumbar lordosis  PALPATION: None to palpation.   LOWER EXTREMITY ROM:  Active ROM Right eval Left eval  Hip flexion [facility]  Hip abduction [facility]  Hip internal rotation [facility]  Hip external rotation [facility]  Knee flexion [facility]  Knee extension 95%  WFL  Ankle dorsiflexion [facility] WFL  Ankle plantarflexion WFL WFL   (Blank rows = not tested)  LOWER EXTREMITY MMT:  MMT Right eval Left eval  Hip flexion 5 5  Hip extension 5 5  Hip abduction 4 4  Knee flexion 5 5  Knee extension 5 5  Ankle dorsiflexion 4+ 4+  Ankle inversion 4+ 5  Ankle eversion 4+ 5   (Blank rows = not tested)  FUNCTIONAL TESTS:  Eval: 5 times sit to stand: 24.64sec with reports of pain in bilat knees.  Timed up and go (TUG): 16.06 sec   09/15/2022: 3 minute walk:  577 ft  09/28/2022: 5 times sit to stand: 12.4 sec Timed up and go (TUG): 7.32 sec   10/10/2022: Able to ambulate 1,000 ft in 4 min 28 sec.  Patient with some reports of soreness with ambulation.  GAIT: Distance walked: 33ft   Assistive device utilized: None Level of assistance: Complete Independence Comments: Decreased trunk  rotation, decreased arm swing, decreased stance length.   BERG BALANCE TEST Eval: Sitting to Standing: 4.      Stands without using hands and stabilize independently Standing Unsupported: 4.      Stands safely for 2 minutes Sitting Unsupported: 4.     Sits for 2 minutes independently Standing to Sitting: 4.     Sits safely with minimal use of hands Transfers: 4.     Transfers safely with minor use of hands Standing with eyes closed: 4.     Stands safely for 10 seconds  Standing with feet together: 4.     Stands for 1 minute safely Reaching forward with outstretched arm: 4.     Reaches forward 10 inches Retrieving object from the floor: 1.     Unable to pick up and needs supervision Turning to look behind: 3.     Looks behind one side only, other side less weight shift Turning 360 degrees: 3.     Able to turn on one side in </= 4 seconds Place alternate foot on stool: 3.     Completes 8 steps in >20 seconds Standing with one foot in front: 2.     Independent small step for 30 seconds Standing on one foot: 4.     Holds >10 seconds  Total Score: 48/56  10/10/2022: BERG 56/56  VESTIBULAR ASSESSMENT: 09/06/2022: Some decreased speed/slowness with tracking, especially towards left side Saccades are WFL Gaze stabilization is WFL Difficult to assess head thrust as pt had difficulty relaxing (but pt does report dizziness with fast head motions at times) [name] positive on each side with symptoms approx 10 sec    TODAY'S TREATMENT:                                                                                                                               DATE: 11/09/2022 Nustep level 5 x6 min with PT present to discuss status Tandem gait on AirEx Beam x4 laps down and back in parallel bars Side stepping on AirEx Beam x4 laps down and back in parallel bars Standing on bosu x2 min with UE support of parallel bars as needed Standing on bosu performing head rotations and head nods 2x30  sec each FWD step ups on bosu x10 bilat Side step up and over bosu x10 bilat Rocker board DF/PF x2 min Rocker board balancing against lateral movement x2 min  Forward T reach towards cone x10 bilat   DATE: 11/07/2022 Nustep level 5 x6 min with PT present to discuss status Ambulation down PT gym hallway with head nods and head rotations x2 laps each Sitting on blue pball:  bouncing with head turns and head nods 2x1 min each Sitting on blue pball:  marching and LAQ 2x10 bilat (with PT holding ball for improved balance) Tandem gait 4x10 ft Marching on trampoline 2x1 min with UE support as needed Retrogait on treadmill -0.4 mph x3 min with UE support   DATE: 10/24/2022 Nustep level 5 x 5 min Standing VOR x1 horizontal and vertical 2x30 sec Standing smooth pursuit horizontal and vertical 2x30 sec Standing on pink airex feet apart eyes closed head turns 2x30 sec, head nods 2x30 sec Standing on pink airex, foot tap forward 2x10, foot tap to the L &R 2x10 Ambulation down PT gym hallway with head nods and head rotations x2 laps each Self Epley maneuver x3 Vestibular ball x10 CW & then CCW x10   DATE: 10/12/2022 Nustep level 5 x6 min with PT present to discuss status Ambulation down PT gym hallway with head nods and head rotations x2 laps each Tandem gait and side stepping on AirEx Beam in parallel bars x4 laps each Standing sideways on AirEx beam performing head turns and nods x1 min each Standing sideways on AirEx beam with eyes closed 2x30 sec FWD step up onto bosu x10 bilat Side step up and over onto bosu x10 bilat Canalith repositioning with Epley on left side x3 with less symptoms noted on second trial and nearly no nystagmus noted on 3rd trial      PATIENT EDUCATION:  Education details: Issued HEP Person educated: Patient Education method: Explanation Education comprehension: verbalized understanding  HOME EXERCISE PROGRAM: Access Code: [email or web address] URL:  [email or web address] Date: 10/12/2022 Prepared by: Clydie Braun Shivali Quackenbush  Exercises - Pencil Pushups  - 1 x daily - 7 x weekly - 2 sets - 10 reps - Seated Horizontal Smooth Pursuit  - 1 x daily - 7 x weekly - 1 sets - 26 reps - Seated Gaze Stabilization with Head Rotation  - 1 x daily - 7 x weekly - 1 sets - 20 reps - Seated Gaze Stabilization with Head Nod  - 1 x daily - 7 x weekly - 1 sets - 20 reps - Brandt-Daroff Vestibular Exercise  - 1 x daily - 7 x weekly - 1 sets - 3 reps - Tandem Walking with Counter Support  - 1 x daily - 7 x weekly - 2 sets - 10 reps - Single Leg Stance with Support  - 1 x daily - 7 x weekly - 1 sets - 2 reps - 20 sec hold - Sit to Stand with Arms Crossed  - 1 x daily - 7 x weekly - 2 sets - 10 reps - Self-Epley Maneuver Left Ear  - 1 x daily - 7 x weekly - 1 sets - 2 reps  Patient Education - BPPV   ASSESSMENT:  CLINICAL IMPRESSION:  Mr Maddocks presents to skilled PT with a left wrist brace in place and continues to deny dizziness.  Patient able to progress with standing balance exercises and core strengthening.  Added forward T for hip stability and change in head position, patient without any complaints of dizziness throughout.  Patient with some difficulty with more challenging balance exercises and occasionally grabbed onto parallel bars for balance.  Patient continues to progress with decreased dizziness and improved balance.  OBJECTIVE IMPAIRMENTS: decreased activity tolerance, difficulty walking, decreased balance, decreased endurance, decreased mobility, decreased ROM, decreased strength, impaired flexibility, impaired UE/LE use, postural dysfunction, and pain.  ACTIVITY LIMITATIONS: bending, lifting, carry, locomotion, cleaning, community activity, driving, and or occupation  PERSONAL FACTORS: HTN, frequent falls are also affecting patient's functional outcome.  REHAB POTENTIAL: Good  CLINICAL DECISION MAKING:  Stable/uncomplicated  EVALUATION COMPLEXITY: Moderate    GOALS: Short term PT Goals Target date: 09/13/2022  Pt will be I and compliant with HEP. Baseline:  Goal status: MET  Long term PT goals Target date: 12/02/2022 Pt will improve ankle strength to at least 5-/5 MMT to improve functional strength Baseline: Goal status: ONGOING  Pt will improve BERG by at least 3 points in order to demonstrate clinically significant improvement in balance.  Baseline:  48/56 Goal status: MET on 10/10/2022  Pt will reduce pain by overall 50% overall with usual activity Baseline: Goal status: IN PROGRESS  Pt will improve TUG by 3.4 seconds or Minimal clinically important difference.  Baseline:  16.06 sec Goal status: MET on 09/28/2022  Pt will improve his STS by 2.3 seconds for Minimal clinically important difference.  Baseline:  24.64 sec Goal status: MET on 09/28/2022  Pt will be able to ambulate community distances at least 1000 ft WNL gait pattern without complaints Baseline: Goal status: PARTIALLY MET (still reported some pain on 10/10/2022)         7. Pt will report no dizziness with positional changes.  Baseline: Goal status: ONGOING         8. Pt will deny any new falls during 2 weeks leading to discharge.  Baseline: Goal status: ONGOING   PLAN: PT FREQUENCY: 1-2 times per week   PT DURATION: 8 weeks  PLANNED INTERVENTIONS (unless contraindicated): aquatic PT, Canalith repositioning, cryotherapy, Electrical stimulation, Iontophoresis with 4 mg/ml dexamethasome, Moist heat, traction, Ultrasound, gait training, Therapeutic exercise, balance training, neuromuscular re-education, patient/family education, manual techniques, passive ROM, dry needling, taping, vasopnuematic device, vestibular, spinal manipulations, joint manipulations  PLAN FOR NEXT SESSION: Assess HEP/update PRN, vestibular/balance, strengthening, canalith repositioning as needed      Reather Laurence, PT 11/09/2022,  12:16 PM  [facility] [address], [address] [place], [place] [address] Phone # [phone] Fax [phone]

## 2022-11-14 ENCOUNTER — Ambulatory Visit: Payer: Medicare HMO | Admitting: Physical Therapy

## 2022-11-14 DIAGNOSIS — R42 Dizziness and giddiness: Secondary | ICD-10-CM | POA: Diagnosis not present

## 2022-11-14 DIAGNOSIS — R2689 Other abnormalities of gait and mobility: Secondary | ICD-10-CM | POA: Diagnosis not present

## 2022-11-14 DIAGNOSIS — M5459 Other low back pain: Secondary | ICD-10-CM | POA: Diagnosis not present

## 2022-11-14 DIAGNOSIS — R293 Abnormal posture: Secondary | ICD-10-CM

## 2022-11-14 DIAGNOSIS — M6281 Muscle weakness (generalized): Secondary | ICD-10-CM | POA: Diagnosis not present

## 2022-11-14 NOTE — Therapy (Signed)
OUTPATIENT PHYSICAL THERAPY VESTIBULAR TREATMENT NOTE    Patient Name: Kent Watts MRN: [id number] DOB:29-Apr-1951, 72 y.o., male Today's Date: 11/14/2022    END OF SESSION:  PT End of Session - 11/14/22 1104     Visit Number 15    Date for PT Re-Evaluation 12/02/22    Authorization Type [employer] MEDICARE    Progress Note Due on Visit 20    PT Start Time 1104    PT Stop Time 1145    PT Time Calculation (min) 41 min    Activity Tolerance Patient tolerated treatment well    Behavior During Therapy WFL for tasks assessed/performed               Past Medical History:  Diagnosis Date   Hypertension    Past Surgical History:  Procedure Laterality Date   KNEE SURGERY Right    There are no problems to display for this patient.   REFERRING PROVIDER: Roger Kill, PA-C   REFERRING DIAG: Repeated falls [R29.6], Personal history of fall [Z91.81], Other symptoms and signs involving the musculoskeletal system [R29.898], Other musculoskeletal symptoms referable to limbs(729.89) [R29.898]   THERAPY DIAG:  Other low back pain  Muscle weakness (generalized)  Other abnormalities of gait and mobility  Abnormal posture  Dizziness and giddiness  Rationale for Evaluation and Treatment: Rehabilitation  ONSET DATE: Over a year ago is when he started falling   SUBJECTIVE:   SUBJECTIVE STATEMENT: Pt states he still does not feel fully recovered from the fall down the hill. Continues to endorse no dizziness. Pt does note increased neck stiffness.   PERTINENT HISTORY: HTN, recent cataract surgery PAIN:  Are you having pain? Yes: NPRS scale: 0-5/10 Pain location: general body ache Pain description: sharp  Aggravating factors: certain movements Relieving factors: CBD oil  PRECAUTIONS: Fall  WEIGHT BEARING RESTRICTIONS: No  FALLS:  Has patient fallen in last 6 months? Yes. Number of falls 2  LIVING ENVIRONMENT: Lives with: lives with their spouse Lives in:  House/apartment Stairs: Yes: External: 3 steps; none Has following equipment at home: Single point cane, Walker - 4 wheeled, and shower chair  OCCUPATION: Retired.   PLOF: Independent  PATIENT GOALS: Improve balance, strength and activity tolerance.   OBJECTIVE:   DIAGNOSTIC FINDINGS: 1. No acute fracture. 2. 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 3. Nonobstructive right renal calculus.   Left Wrist Radiograph on 10/28/2022: IMPRESSION: 1. No acute fracture. 2. No malalignment. 3. 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   COGNITION: Overall cognitive status: Within functional limits for tasks assessed     SENSATION: WFL  POSTURE: rounded shoulders, forward head, and decreased lumbar lordosis  PALPATION: None to palpation.   LOWER EXTREMITY ROM:  Active ROM Right eval Left eval  Hip flexion [facility]  Hip abduction [facility]  Hip internal rotation [facility]  Hip external rotation [facility]  Knee flexion [facility]  Knee extension 95%  WFL  Ankle dorsiflexion [facility] WFL  Ankle plantarflexion WFL WFL   (Blank rows = not tested)  LOWER EXTREMITY MMT:  MMT Right eval Left eval  Hip flexion 5 5  Hip extension 5 5  Hip abduction 4 4  Knee flexion 5 5  Knee extension 5 5  Ankle dorsiflexion 4+ 4+  Ankle inversion 4+ 5  Ankle eversion 4+ 5   (Blank rows = not tested)  FUNCTIONAL TESTS:  Eval: 5 times sit to  stand: 24.64sec with reports of pain in bilat knees.  Timed up and go (TUG): 16.06 sec   09/15/2022: 3 minute walk:  577 ft  09/28/2022: 5 times sit to stand: 12.4 sec Timed up and go (TUG): 7.32 sec   10/10/2022: Able to ambulate 1,000 ft in 4 min 28 sec.  Patient with some reports of soreness with ambulation.  GAIT: Distance walked: 43ft   Assistive device utilized: None Level of assistance: Complete Independence Comments: Decreased trunk rotation, decreased arm swing, decreased  stance length.   BERG BALANCE TEST Eval: Sitting to Standing: 4.      Stands without using hands and stabilize independently Standing Unsupported: 4.      Stands safely for 2 minutes Sitting Unsupported: 4.     Sits for 2 minutes independently Standing to Sitting: 4.     Sits safely with minimal use of hands Transfers: 4.     Transfers safely with minor use of hands Standing with eyes closed: 4.     Stands safely for 10 seconds  Standing with feet together: 4.     Stands for 1 minute safely Reaching forward with outstretched arm: 4.     Reaches forward 10 inches Retrieving object from the floor: 1.     Unable to pick up and needs supervision Turning to look behind: 3.     Looks behind one side only, other side less weight shift Turning 360 degrees: 3.     Able to turn on one side in </= 4 seconds Place alternate foot on stool: 3.     Completes 8 steps in >20 seconds Standing with one foot in front: 2.     Independent small step for 30 seconds Standing on one foot: 4.     Holds >10 seconds  Total Score: 48/56  10/10/2022: BERG 56/56  VESTIBULAR ASSESSMENT: 09/06/2022: Some decreased speed/slowness with tracking, especially towards left side Saccades are WFL Gaze stabilization is WFL Difficult to assess head thrust as pt had difficulty relaxing (but pt does report dizziness with fast head motions at times) [name] positive on each side with symptoms approx 10 sec    TODAY'S TREATMENT:                                                                                                                              DATE: 11/14/2022 Nustep level 6 x 6 min LTR 5x10 sec SKTC x30 sec DKTC x30 sec Manual therapy: STM & TPR UT, cervical paraspinal; cervical side glides grade II and III Tandem gait on AirEx Beam x4 laps down and back in parallel bars with head turns x2 laps With head nods x2 laps Side stepping on AirEx Beam x4 laps down and back in parallel bars with head turns x2  laps With head nods x2 laps Forward T x10  DATE: 11/09/2022 Nustep level 5 x6 min with PT present to discuss status Tandem gait on AirEx Beam x4  laps down and back in parallel bars Side stepping on AirEx Beam x4 laps down and back in parallel bars Standing on bosu x2 min with UE support of parallel bars as needed Standing on bosu performing head rotations and head nods 2x30 sec each FWD step ups on bosu x10 bilat Side step up and over bosu x10 bilat Rocker board DF/PF x2 min Rocker board balancing against lateral movement x2 min Forward T reach towards cone x10 bilat   DATE: 11/07/2022 Nustep level 5 x6 min with PT present to discuss status Ambulation down PT gym hallway with head nods and head rotations x2 laps each Sitting on blue pball:  bouncing with head turns and head nods 2x1 min each Sitting on blue pball:  marching and LAQ 2x10 bilat (with PT holding ball for improved balance) Tandem gait 4x10 ft Marching on trampoline 2x1 min with UE support as needed Retrogait on treadmill -0.4 mph x3 min with UE support   DATE: 10/24/2022 Nustep level 5 x 5 min Standing VOR x1 horizontal and vertical 2x30 sec Standing smooth pursuit horizontal and vertical 2x30 sec Standing on pink airex feet apart eyes closed head turns 2x30 sec, head nods 2x30 sec Standing on pink airex, foot tap forward 2x10, foot tap to the L &R 2x10 Ambulation down PT gym hallway with head nods and head rotations x2 laps each Self Epley maneuver x3 Vestibular ball x10 CW & then CCW x10     PATIENT EDUCATION:  Education details: Issued HEP Person educated: Patient Education method: Explanation Education comprehension: verbalized understanding  HOME EXERCISE PROGRAM: Access Code: [email or web address] URL: [email or web address] Date: 10/12/2022 Prepared by: Clydie Braun Menke  Exercises - Pencil Pushups  - 1 x daily - 7 x weekly - 2 sets - 10 reps - Seated Horizontal Smooth Pursuit  - 1 x daily - 7 x weekly  - 1 sets - 26 reps - Seated Gaze Stabilization with Head Rotation  - 1 x daily - 7 x weekly - 1 sets - 20 reps - Seated Gaze Stabilization with Head Nod  - 1 x daily - 7 x weekly - 1 sets - 20 reps - Brandt-Daroff Vestibular Exercise  - 1 x daily - 7 x weekly - 1 sets - 3 reps - Tandem Walking with Counter Support  - 1 x daily - 7 x weekly - 2 sets - 10 reps - Single Leg Stance with Support  - 1 x daily - 7 x weekly - 1 sets - 2 reps - 20 sec hold - Sit to Stand with Arms Crossed  - 1 x daily - 7 x weekly - 2 sets - 10 reps - Self-Epley Maneuver Left Ear  - 1 x daily - 7 x weekly - 1 sets - 2 reps  Patient Education - BPPV   ASSESSMENT:  CLINICAL IMPRESSION:  Increased cervical and lumbar stiffness today limiting mobility. Performed stretches and manual work to address pt's pain. Continued to work on airex beam today and progressed pt to performing head turns/head nods. No LOB today while performing side stepping on airex beam.   OBJECTIVE IMPAIRMENTS: decreased activity tolerance, difficulty walking, decreased balance, decreased endurance, decreased mobility, decreased ROM, decreased strength, impaired flexibility, impaired UE/LE use, postural dysfunction, and pain.  ACTIVITY LIMITATIONS: bending, lifting, carry, locomotion, cleaning, community activity, driving, and or occupation  PERSONAL FACTORS: HTN, frequent falls are also affecting patient's functional outcome.  REHAB POTENTIAL: Good  CLINICAL DECISION MAKING: Stable/uncomplicated  EVALUATION COMPLEXITY: Moderate  GOALS: Short term PT Goals Target date: 09/13/2022  Pt will be I and compliant with HEP. Baseline:  Goal status: MET  Long term PT goals Target date: 12/02/2022 Pt will improve ankle strength to at least 5-/5 MMT to improve functional strength Baseline: Goal status: ONGOING  Pt will improve BERG by at least 3 points in order to demonstrate clinically significant improvement in balance.  Baseline:   48/56 Goal status: MET on 10/10/2022  Pt will reduce pain by overall 50% overall with usual activity Baseline: Goal status: IN PROGRESS  Pt will improve TUG by 3.4 seconds or Minimal clinically important difference.  Baseline:  16.06 sec Goal status: MET on 09/28/2022  Pt will improve his STS by 2.3 seconds for Minimal clinically important difference.  Baseline:  24.64 sec Goal status: MET on 09/28/2022  Pt will be able to ambulate community distances at least 1000 ft WNL gait pattern without complaints Baseline: Goal status: PARTIALLY MET (still reported some pain on 10/10/2022)         7. Pt will report no dizziness with positional changes.  Baseline: Goal status: MET on 11/14/22 (has had no dizziness)         8. Pt will deny any new falls during 2 weeks leading to discharge.  Baseline: Goal status: ONGOING   PLAN: PT FREQUENCY: 1-2 times per week   PT DURATION: 8 weeks  PLANNED INTERVENTIONS (unless contraindicated): aquatic PT, Canalith repositioning, cryotherapy, Electrical stimulation, Iontophoresis with 4 mg/ml dexamethasome, Moist heat, traction, Ultrasound, gait training, Therapeutic exercise, balance training, neuromuscular re-education, patient/family education, manual techniques, passive ROM, dry needling, taping, vasopnuematic device, vestibular, spinal manipulations, joint manipulations  PLAN FOR NEXT SESSION: Assess HEP/update PRN, vestibular/balance, strengthening, canalith repositioning as needed      [facility] April Ma L Candelario Steppe, PT 11/14/2022, 11:04 AM  [facility] [address] [place], [place] [address] Phone # [phone] Fax [phone]

## 2022-11-21 ENCOUNTER — Ambulatory Visit: Payer: Medicare HMO | Admitting: Physical Therapy

## 2022-11-21 DIAGNOSIS — M5459 Other low back pain: Secondary | ICD-10-CM | POA: Diagnosis not present

## 2022-11-21 DIAGNOSIS — M6281 Muscle weakness (generalized): Secondary | ICD-10-CM

## 2022-11-21 DIAGNOSIS — R293 Abnormal posture: Secondary | ICD-10-CM | POA: Diagnosis not present

## 2022-11-21 DIAGNOSIS — R2689 Other abnormalities of gait and mobility: Secondary | ICD-10-CM | POA: Diagnosis not present

## 2022-11-21 DIAGNOSIS — R42 Dizziness and giddiness: Secondary | ICD-10-CM

## 2022-11-21 NOTE — Therapy (Signed)
OUTPATIENT PHYSICAL THERAPY VESTIBULAR TREATMENT NOTE    Patient Name: Kent Watts MRN: [id number] DOB:08/18/1950, 73 y.o., male Today's Date: 11/21/2022    END OF SESSION:  PT End of Session - 11/21/22 1106     Visit Number 16    Date for PT Re-Evaluation 12/02/22    Authorization Type [employer] MEDICARE    Progress Note Due on Visit 20    PT Start Time 1106    PT Stop Time 1145    PT Time Calculation (min) 39 min    Activity Tolerance Patient tolerated treatment well    Behavior During Therapy WFL for tasks assessed/performed                Past Medical History:  Diagnosis Date   Hypertension    Past Surgical History:  Procedure Laterality Date   KNEE SURGERY Right    There are no problems to display for this patient.   REFERRING PROVIDER: Roger Kill, PA-C   REFERRING DIAG: Repeated falls [R29.6], Personal history of fall [Z91.81], Other symptoms and signs involving the musculoskeletal system [R29.898], Other musculoskeletal symptoms referable to limbs(729.89) [R29.898]   THERAPY DIAG:  Other low back pain  Muscle weakness (generalized)  Other abnormalities of gait and mobility  Abnormal posture  Dizziness and giddiness  Rationale for Evaluation and Treatment: Rehabilitation  ONSET DATE: Over a year ago is when he started falling   SUBJECTIVE:   SUBJECTIVE STATEMENT: Pt reports doing okay. Continues to report no dizziness. Neck has been feeling pretty good.   PERTINENT HISTORY: HTN, recent cataract surgery PAIN:  Are you having pain? Yes: NPRS scale: 0-5/10 Pain location: general body ache Pain description: sharp  Aggravating factors: certain movements Relieving factors: CBD oil  PRECAUTIONS: Fall  WEIGHT BEARING RESTRICTIONS: No  FALLS:  Has patient fallen in last 6 months? Yes. Number of falls 2  LIVING ENVIRONMENT: Lives with: lives with their spouse Lives in: House/apartment Stairs: Yes: External: 3 steps; none Has  following equipment at home: Single point cane, Walker - 4 wheeled, and shower chair  OCCUPATION: Retired.   PLOF: Independent  PATIENT GOALS: Improve balance, strength and activity tolerance.   OBJECTIVE:   DIAGNOSTIC FINDINGS: 1. No acute fracture. 2. 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 3. Nonobstructive right renal calculus.   Left Wrist Radiograph on 10/28/2022: IMPRESSION: 1. No acute fracture. 2. No malalignment. 3. 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   COGNITION: Overall cognitive status: Within functional limits for tasks assessed     SENSATION: WFL  POSTURE: rounded shoulders, forward head, and decreased lumbar lordosis  PALPATION: None to palpation.   LOWER EXTREMITY ROM:  Active ROM Right eval Left eval  Hip flexion [facility]  Hip abduction [facility]  Hip internal rotation [facility]  Hip external rotation [facility]  Knee flexion [facility] WFL  Knee extension 95%  WFL  Ankle dorsiflexion [facility] WFL  Ankle plantarflexion WFL WFL   (Blank rows = not tested)  LOWER EXTREMITY MMT:  MMT Right eval Left eval  Hip flexion 5 5  Hip extension 5 5  Hip abduction 4 4  Knee flexion 5 5  Knee extension 5 5  Ankle dorsiflexion 4+ 4+  Ankle inversion 4+ 5  Ankle eversion 4+ 5   (Blank rows = not tested)  FUNCTIONAL TESTS:  Eval: 5 times sit to stand: 24.64sec with reports of pain in bilat knees.  Timed up and go (TUG): 16.06 sec   09/15/2022: 3 minute walk:  577 ft  09/28/2022: 5 times sit to stand: 12.4 sec Timed up and go (TUG): 7.32 sec   10/10/2022: Able to ambulate 1,000 ft in 4 min 28 sec.  Patient with some reports of soreness with ambulation.  GAIT: Distance walked: 48ft   Assistive device utilized: None Level of assistance: Complete Independence Comments: Decreased trunk rotation, decreased arm swing, decreased stance length.   BERG BALANCE TEST Eval: Sitting to  Standing: 4.      Stands without using hands and stabilize independently Standing Unsupported: 4.      Stands safely for 2 minutes Sitting Unsupported: 4.     Sits for 2 minutes independently Standing to Sitting: 4.     Sits safely with minimal use of hands Transfers: 4.     Transfers safely with minor use of hands Standing with eyes closed: 4.     Stands safely for 10 seconds  Standing with feet together: 4.     Stands for 1 minute safely Reaching forward with outstretched arm: 4.     Reaches forward 10 inches Retrieving object from the floor: 1.     Unable to pick up and needs supervision Turning to look behind: 3.     Looks behind one side only, other side less weight shift Turning 360 degrees: 3.     Able to turn on one side in </= 4 seconds Place alternate foot on stool: 3.     Completes 8 steps in >20 seconds Standing with one foot in front: 2.     Independent small step for 30 seconds Standing on one foot: 4.     Holds >10 seconds Total Score: 48/56  10/10/2022: BERG 56/56  VESTIBULAR ASSESSMENT: 09/06/2022: Some decreased speed/slowness with tracking, especially towards left side Saccades are WFL Gaze stabilization is WFL Difficult to assess head thrust as pt had difficulty relaxing (but pt does report dizziness with fast head motions at times) [name] positive on each side with symptoms approx 10 sec    TODAY'S TREATMENT:                                                                                                                              DATE: 11/21/2022 Recumbent bike L2 x 5 min Fwd Tandem gait on airex beam x 4 laps, head turns and then head nods x2 laps each Bwd tandem gait on airex beam x4 laps Side stepping on airex beam x 4 laps, head turns and then head nods x2 laps each Standing on airex beam, tap fwd on cone 2x10, tap to the side 2x10 Rockerboard fwd/bwd 2x10, static standing 2x30 sec Rockerboard L<>R 2x10, static standing 2x30 sec On blue side of  bosu Single leg heel raise 2x10 Single leg stance 3x10 sec Forward T x10 bilat  DATE: 11/14/2022 Nustep level 6 x 6 min LTR 5x10 sec SKTC x30  sec DKTC x30 sec Manual therapy: STM & TPR UT, cervical paraspinal; cervical side glides grade II and III Tandem gait on AirEx Beam x4 laps down and back in parallel bars with head turns x2 laps With head nods x2 laps Side stepping on AirEx Beam x4 laps down and back in parallel bars with head turns x2 laps With head nods x2 laps Forward T x10  DATE: 11/09/2022 Nustep level 5 x6 min with PT present to discuss status Tandem gait on AirEx Beam x4 laps down and back in parallel bars Side stepping on AirEx Beam x4 laps down and back in parallel bars Standing on bosu x2 min with UE support of parallel bars as needed Standing on bosu performing head rotations and head nods 2x30 sec each FWD step ups on bosu x10 bilat Side step up and over bosu x10 bilat Rocker board DF/PF x2 min Rocker board balancing against lateral movement x2 min Forward T reach towards cone x10 bilat   DATE: 11/07/2022 Nustep level 5 x6 min with PT present to discuss status Ambulation down PT gym hallway with head nods and head rotations x2 laps each Sitting on blue pball:  bouncing with head turns and head nods 2x1 min each Sitting on blue pball:  marching and LAQ 2x10 bilat (with PT holding ball for improved balance) Tandem gait 4x10 ft Marching on trampoline 2x1 min with UE support as needed Retrogait on treadmill -0.4 mph x3 min with UE support    PATIENT EDUCATION:  Education details: Issued HEP Person educated: Patient Education method: Explanation Education comprehension: verbalized understanding  HOME EXERCISE PROGRAM: Access Code: [email or web address] URL: [email or web address] Date: 10/12/2022 Prepared by: Clydie Braun Menke  Exercises - Pencil Pushups  - 1 x daily - 7 x weekly - 2 sets - 10 reps - Seated Horizontal Smooth Pursuit  - 1 x daily - 7 x  weekly - 1 sets - 26 reps - Seated Gaze Stabilization with Head Rotation  - 1 x daily - 7 x weekly - 1 sets - 20 reps - Seated Gaze Stabilization with Head Nod  - 1 x daily - 7 x weekly - 1 sets - 20 reps - Brandt-Daroff Vestibular Exercise  - 1 x daily - 7 x weekly - 1 sets - 3 reps - Tandem Walking with Counter Support  - 1 x daily - 7 x weekly - 2 sets - 10 reps - Single Leg Stance with Support  - 1 x daily - 7 x weekly - 1 sets - 2 reps - 20 sec hold - Sit to Stand with Arms Crossed  - 1 x daily - 7 x weekly - 2 sets - 10 reps - Self-Epley Maneuver Left Ear  - 1 x daily - 7 x weekly - 1 sets - 2 reps  Patient Education - BPPV   ASSESSMENT:  CLINICAL IMPRESSION:  Continued to work on balance and stability on airex beam and bosu. Pt is demonstrating less LOBs on beam. Worked on ankle strengthening for improved ankle strategy. Highly challenged on R LE.   OBJECTIVE IMPAIRMENTS: decreased activity tolerance, difficulty walking, decreased balance, decreased endurance, decreased mobility, decreased ROM, decreased strength, impaired flexibility, impaired UE/LE use, postural dysfunction, and pain.  ACTIVITY LIMITATIONS: bending, lifting, carry, locomotion, cleaning, community activity, driving, and or occupation  PERSONAL FACTORS: HTN, frequent falls are also affecting patient's functional outcome.  REHAB POTENTIAL: Good  CLINICAL DECISION MAKING: Stable/uncomplicated  EVALUATION COMPLEXITY: Moderate    GOALS: Short term PT Goals Target  date: 09/13/2022  Pt will be I and compliant with HEP. Baseline:  Goal status: MET  Long term PT goals Target date: 12/02/2022 Pt will improve ankle strength to at least 5-/5 MMT to improve functional strength Baseline: Goal status: ONGOING  Pt will improve BERG by at least 3 points in order to demonstrate clinically significant improvement in balance.  Baseline:  48/56 Goal status: MET on 10/10/2022  Pt will reduce pain by overall 50% overall  with usual activity Baseline: Goal status: IN PROGRESS  Pt will improve TUG by 3.4 seconds or Minimal clinically important difference.  Baseline:  16.06 sec Goal status: MET on 09/28/2022  Pt will improve his STS by 2.3 seconds for Minimal clinically important difference.  Baseline:  24.64 sec Goal status: MET on 09/28/2022  Pt will be able to ambulate community distances at least 1000 ft WNL gait pattern without complaints Baseline: Goal status: PARTIALLY MET (still reported some pain on 10/10/2022)         7. Pt will report no dizziness with positional changes.  Baseline: Goal status: MET on 11/14/22 (has had no dizziness)         8. Pt will deny any new falls during 2 weeks leading to discharge.  Baseline: Goal status: ONGOING   PLAN: PT FREQUENCY: 1-2 times per week   PT DURATION: 8 weeks  PLANNED INTERVENTIONS (unless contraindicated): aquatic PT, Canalith repositioning, cryotherapy, Electrical stimulation, Iontophoresis with 4 mg/ml dexamethasome, Moist heat, traction, Ultrasound, gait training, Therapeutic exercise, balance training, neuromuscular re-education, patient/family education, manual techniques, passive ROM, dry needling, taping, vasopnuematic device, vestibular, spinal manipulations, joint manipulations  PLAN FOR NEXT SESSION: Assess HEP/update PRN, vestibular/balance, strengthening, canalith repositioning as needed      [facility] April Ma L Elza Sortor, PT 11/21/2022, 11:09 AM  [facility] [address], [address] [place], [place] [address] Phone # [phone] Fax [phone]

## 2022-11-28 ENCOUNTER — Ambulatory Visit: Payer: Medicare HMO | Admitting: Rehabilitative and Restorative Service Providers"

## 2022-11-28 ENCOUNTER — Encounter: Payer: Self-pay | Admitting: Rehabilitative and Restorative Service Providers"

## 2022-11-28 DIAGNOSIS — R2689 Other abnormalities of gait and mobility: Secondary | ICD-10-CM | POA: Diagnosis not present

## 2022-11-28 DIAGNOSIS — R42 Dizziness and giddiness: Secondary | ICD-10-CM | POA: Diagnosis not present

## 2022-11-28 DIAGNOSIS — M6281 Muscle weakness (generalized): Secondary | ICD-10-CM

## 2022-11-28 DIAGNOSIS — M5459 Other low back pain: Secondary | ICD-10-CM

## 2022-11-28 DIAGNOSIS — R293 Abnormal posture: Secondary | ICD-10-CM

## 2022-11-28 NOTE — Therapy (Signed)
OUTPATIENT PHYSICAL THERAPY VESTIBULAR TREATMENT NOTE    Patient Name: Kent Watts MRN: [id number] DOB:1950-11-23, 72 y.o., male Today's Date: 11/28/2022    END OF SESSION:  PT End of Session - 11/28/22 1405     Visit Number 17    Date for PT Re-Evaluation 12/02/22    Authorization Type [employer] MEDICARE    Progress Note Due on Visit 20    PT Start Time 1400    PT Stop Time 1440    PT Time Calculation (min) 40 min    Activity Tolerance Patient tolerated treatment well    Behavior During Therapy WFL for tasks assessed/performed                Past Medical History:  Diagnosis Date   Hypertension    Past Surgical History:  Procedure Laterality Date   KNEE SURGERY Right    There are no problems to display for this patient.   REFERRING PROVIDER: Roger Kill, PA-C   REFERRING DIAG: Repeated falls [R29.6], Personal history of fall [Z91.81], Other symptoms and signs involving the musculoskeletal system [R29.898], Other musculoskeletal symptoms referable to limbs(729.89) [R29.898]   THERAPY DIAG:  Other low back pain  Muscle weakness (generalized)  Other abnormalities of gait and mobility  Abnormal posture  Dizziness and giddiness  Rationale for Evaluation and Treatment: Rehabilitation  ONSET DATE: Over a year ago is when he started falling   SUBJECTIVE:   SUBJECTIVE STATEMENT: Pt continues to deny dizziness.  Denies pain upon entry to visit.  PERTINENT HISTORY: HTN, recent cataract surgery PAIN:  Are you having pain? Yes: NPRS scale: 0-5/10 Pain location: general body ache Pain description: sharp  Aggravating factors: certain movements Relieving factors: CBD oil  PRECAUTIONS: Fall  WEIGHT BEARING RESTRICTIONS: No  FALLS:  Has patient fallen in last 6 months? Yes. Number of falls 2  LIVING ENVIRONMENT: Lives with: lives with their spouse Lives in: House/apartment Stairs: Yes: External: 3 steps; none Has following equipment at home:  Single point cane, Walker - 4 wheeled, and shower chair  OCCUPATION: Retired.   PLOF: Independent  PATIENT GOALS: Improve balance, strength and activity tolerance.   OBJECTIVE:   DIAGNOSTIC FINDINGS: 1. No acute fracture. 2. 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 3. Nonobstructive right renal calculus.   Left Wrist Radiograph on 10/28/2022: IMPRESSION: 1. No acute fracture. 2. No malalignment. 3. 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   COGNITION: Overall cognitive status: Within functional limits for tasks assessed     SENSATION: WFL  POSTURE: rounded shoulders, forward head, and decreased lumbar lordosis  PALPATION: None to palpation.   LOWER EXTREMITY ROM:  Active ROM Right eval Left eval  Hip flexion [facility]  Hip abduction [facility]  Hip internal rotation [facility]  Hip external rotation [facility]  Knee flexion [facility] WFL  Knee extension 95%  WFL  Ankle dorsiflexion [facility] WFL  Ankle plantarflexion WFL WFL   (Blank rows = not tested)  LOWER EXTREMITY MMT:  MMT Right eval Left eval  Hip flexion 5 5  Hip extension 5 5  Hip abduction 4 4  Knee flexion 5 5  Knee extension 5 5  Ankle dorsiflexion 4+ 4+  Ankle inversion 4+ 5  Ankle eversion 4+ 5   (Blank rows = not tested)  FUNCTIONAL TESTS:  Eval: 5 times sit to stand: 24.64sec with reports of pain in bilat knees.  Timed up and go (  TUG): 16.06 sec   09/15/2022: 3 minute walk:  577 ft  09/28/2022: 5 times sit to stand: 12.4 sec Timed up and go (TUG): 7.32 sec   10/10/2022: Able to ambulate 1,000 ft in 4 min 28 sec.  Patient with some reports of soreness with ambulation.  GAIT: Distance walked: 61ft   Assistive device utilized: None Level of assistance: Complete Independence Comments: Decreased trunk rotation, decreased arm swing, decreased stance length.   BERG BALANCE TEST Eval: Sitting to Standing: 4.      Stands without  using hands and stabilize independently Standing Unsupported: 4.      Stands safely for 2 minutes Sitting Unsupported: 4.     Sits for 2 minutes independently Standing to Sitting: 4.     Sits safely with minimal use of hands Transfers: 4.     Transfers safely with minor use of hands Standing with eyes closed: 4.     Stands safely for 10 seconds  Standing with feet together: 4.     Stands for 1 minute safely Reaching forward with outstretched arm: 4.     Reaches forward 10 inches Retrieving object from the floor: 1.     Unable to pick up and needs supervision Turning to look behind: 3.     Looks behind one side only, other side less weight shift Turning 360 degrees: 3.     Able to turn on one side in </= 4 seconds Place alternate foot on stool: 3.     Completes 8 steps in >20 seconds Standing with one foot in front: 2.     Independent small step for 30 seconds Standing on one foot: 4.     Holds >10 seconds Total Score: 48/56  10/10/2022: BERG 56/56  VESTIBULAR ASSESSMENT: 09/06/2022: Some decreased speed/slowness with tracking, especially towards left side Saccades are WFL Gaze stabilization is WFL Difficult to assess head thrust as pt had difficulty relaxing (but pt does report dizziness with fast head motions at times) Kent Watts positive on each side with symptoms approx 10 sec    TODAY'S TREATMENT:                                                                                                                               DATE: 11/21/2022 Nustep level 5 x6 min with PT present to discuss status Fwd Tandem gait on airex beam x 4 laps, head turns and then head nods x2 laps each Bwd tandem gait on airex beam x4 laps Side stepping with horizontal head turns x4 laps Ambulation over various objects for obstacle course 4x10 ft Standing forward T with occasional UE support of parallel bars 2x15 FWD step ups onto Power Plate 5V76 Side step ups onto Power Plate H60 bilat with cuing for  more eccentric muscle control Standing hamstring stretch with stretch function of Power Plate 30Hz  x30 sec bilat Mini squats on strengthen function on power plate at 35 Hz 7P71 sec  Side lunges onto Bosu x10 bilat FWD steps ups on bosu x10 bilat   DATE: 11/21/2022 Recumbent bike L2 x 5 min Fwd Tandem gait on airex beam x 4 laps, head turns and then head nods x2 laps each Bwd tandem gait on airex beam x4 laps Side stepping on airex beam x 4 laps, head turns and then head nods x2 laps each Standing on airex beam, tap fwd on cone 2x10, tap to the side 2x10 Rockerboard fwd/bwd 2x10, static standing 2x30 sec Rockerboard L<>R 2x10, static standing 2x30 sec On blue side of bosu Single leg heel raise 2x10 Single leg stance 3x10 sec Forward T x10 bilat  DATE: 11/14/2022 Nustep level 6 x 6 min LTR 5x10 sec SKTC x30 sec DKTC x30 sec Manual therapy: STM & TPR UT, cervical paraspinal; cervical side glides grade II and III Tandem gait on AirEx Beam x4 laps down and back in parallel bars with head turns x2 laps With head nods x2 laps Side stepping on AirEx Beam x4 laps down and back in parallel bars with head turns x2 laps With head nods x2 laps Forward T x10     PATIENT EDUCATION:  Education details: Issued HEP Person educated: Patient Education method: Explanation Education comprehension: verbalized understanding  HOME EXERCISE PROGRAM: Access Code: [email or web address] URL: [email or web address] Date: 10/12/2022 Prepared by: Clydie Braun Tiane Szydlowski  Exercises - Pencil Pushups  - 1 x daily - 7 x weekly - 2 sets - 10 reps - Seated Horizontal Smooth Pursuit  - 1 x daily - 7 x weekly - 1 sets - 26 reps - Seated Gaze Stabilization with Head Rotation  - 1 x daily - 7 x weekly - 1 sets - 20 reps - Seated Gaze Stabilization with Head Nod  - 1 x daily - 7 x weekly - 1 sets - 20 reps - Brandt-Daroff Vestibular Exercise  - 1 x daily - 7 x weekly - 1 sets - 3 reps - Tandem Walking with Counter  Support  - 1 x daily - 7 x weekly - 2 sets - 10 reps - Single Leg Stance with Support  - 1 x daily - 7 x weekly - 1 sets - 2 reps - 20 sec hold - Sit to Stand with Arms Crossed  - 1 x daily - 7 x weekly - 2 sets - 10 reps - Self-Epley Maneuver Left Ear  - 1 x daily - 7 x weekly - 1 sets - 2 reps  Patient Education - BPPV   ASSESSMENT:  CLINICAL IMPRESSION:  Today's session targeted dynamic balance activities. Patient continued to demonstrate poor core stabilization as evident by compensatory trunk leans in the anterior, posterior, and lateral directions. In addition to generalized lower extremity weakness during single leg balance and bosu ball exercises with noticeable knee valgus and hip drop during step ups. Patient responded well to verbal cueing for motor control of the step down.   OBJECTIVE IMPAIRMENTS: decreased activity tolerance, difficulty walking, decreased balance, decreased endurance, decreased mobility, decreased ROM, decreased strength, impaired flexibility, impaired UE/LE use, postural dysfunction, and pain.  ACTIVITY LIMITATIONS: bending, lifting, carry, locomotion, cleaning, community activity, driving, and or occupation  PERSONAL FACTORS: HTN, frequent falls are also affecting patient's functional outcome.  REHAB POTENTIAL: Good  CLINICAL DECISION MAKING: Stable/uncomplicated  EVALUATION COMPLEXITY: Moderate    GOALS: Short term PT Goals Target date: 09/13/2022  Pt will be I and compliant with HEP. Baseline:  Goal status: MET  Long term PT goals Target date: 12/02/2022 Pt  will improve ankle strength to at least 5-/5 MMT to improve functional strength Baseline: Goal status: ONGOING  Pt will improve BERG by at least 3 points in order to demonstrate clinically significant improvement in balance.  Baseline:  48/56 Goal status: MET on 10/10/2022  Pt will reduce pain by overall 50% overall with usual activity Baseline: Goal status: IN PROGRESS  Pt will improve  TUG by 3.4 seconds or Minimal clinically important difference.  Baseline:  16.06 sec Goal status: MET on 09/28/2022  Pt will improve his STS by 2.3 seconds for Minimal clinically important difference.  Baseline:  24.64 sec Goal status: MET on 09/28/2022  Pt will be able to ambulate community distances at least 1000 ft WNL gait pattern without complaints Baseline: Goal status: PARTIALLY MET (still reported some pain on 10/10/2022)         7. Pt will report no dizziness with positional changes.  Baseline: Goal status: MET on 11/14/22 (has had no dizziness)         8. Pt will deny any new falls during 2 weeks leading to discharge.  Baseline: Goal status: ONGOING   PLAN: PT FREQUENCY: 1-2 times per week   PT DURATION: 8 weeks  PLANNED INTERVENTIONS (unless contraindicated): aquatic PT, Canalith repositioning, cryotherapy, Electrical stimulation, Iontophoresis with 4 mg/ml dexamethasome, Moist heat, traction, Ultrasound, gait training, Therapeutic exercise, balance training, neuromuscular re-education, patient/family education, manual techniques, passive ROM, dry needling, taping, vasopnuematic device, vestibular, spinal manipulations, joint manipulations  PLAN FOR NEXT SESSION: Discharge next visit if pt continues progress towards goals     Talisa Petrak, PT 11/28/2022, 2:06 PM  Ailene Ards, SPT present throughout visit  [facility] [address] [place], [place] [address] Phone # [phone] Fax [phone]

## 2022-12-01 ENCOUNTER — Encounter: Payer: Self-pay | Admitting: Rehabilitative and Restorative Service Providers"

## 2022-12-01 ENCOUNTER — Ambulatory Visit: Payer: Medicare HMO | Admitting: Rehabilitative and Restorative Service Providers"

## 2022-12-01 DIAGNOSIS — M5459 Other low back pain: Secondary | ICD-10-CM

## 2022-12-01 DIAGNOSIS — R2689 Other abnormalities of gait and mobility: Secondary | ICD-10-CM

## 2022-12-01 DIAGNOSIS — M6281 Muscle weakness (generalized): Secondary | ICD-10-CM | POA: Diagnosis not present

## 2022-12-01 DIAGNOSIS — R42 Dizziness and giddiness: Secondary | ICD-10-CM | POA: Diagnosis not present

## 2022-12-01 DIAGNOSIS — R293 Abnormal posture: Secondary | ICD-10-CM

## 2022-12-01 NOTE — Therapy (Signed)
OUTPATIENT PHYSICAL THERAPY VESTIBULAR TREATMENT NOTE AND DISCHARGE SUMMARY   Patient Name: Kent Watts MRN: [id number] DOB:1950/12/07, 72 y.o., male Today's Date: 12/01/2022    END OF SESSION:  PT End of Session - 12/01/22 1405     Visit Number 18    Date for PT Re-Evaluation 12/02/22    Authorization Type [employer] MEDICARE    PT Start Time 1402    PT Stop Time 1440    PT Time Calculation (min) 38 min    Activity Tolerance Patient tolerated treatment well    Behavior During Therapy WFL for tasks assessed/performed                Past Medical History:  Diagnosis Date   Hypertension    Past Surgical History:  Procedure Laterality Date   KNEE SURGERY Right    There are no problems to display for this patient.   REFERRING PROVIDER: Roger Kill, PA-C   REFERRING DIAG: Repeated falls [R29.6], Personal history of fall [Z91.81], Other symptoms and signs involving the musculoskeletal system [R29.898], Other musculoskeletal symptoms referable to limbs(729.89) [R29.898]   THERAPY DIAG:  Other low back pain  Muscle weakness (generalized)  Other abnormalities of gait and mobility  Abnormal posture  Dizziness and giddiness  Rationale for Evaluation and Treatment: Rehabilitation  ONSET DATE: Over a year ago is when he started falling   SUBJECTIVE:   SUBJECTIVE STATEMENT: Pt denies any pain today.  Patient states that he has not had any falls recently.  PERTINENT HISTORY: HTN, recent cataract surgery PAIN:  Are you having pain? Yes: NPRS scale: 0/10 Pain location: general body ache Pain description: sharp  Aggravating factors: certain movements Relieving factors: CBD oil  PRECAUTIONS: Fall  WEIGHT BEARING RESTRICTIONS: No  FALLS:  Has patient fallen in last 6 months? Yes. Number of falls 2  LIVING ENVIRONMENT: Lives with: lives with their spouse Lives in: House/apartment Stairs: Yes: External: 3 steps; none Has following equipment at  home: Single point cane, Walker - 4 wheeled, and shower chair  OCCUPATION: Retired.   PLOF: Independent  PATIENT GOALS: Improve balance, strength and activity tolerance.   OBJECTIVE:   DIAGNOSTIC FINDINGS: 1. No acute fracture. 2. 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 3. Nonobstructive right renal calculus.   Left Wrist Radiograph on 10/28/2022: IMPRESSION: 1. No acute fracture. 2. No malalignment. 3. 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   COGNITION: Overall cognitive status: Within functional limits for tasks assessed     SENSATION: WFL  POSTURE: rounded shoulders, forward head, and decreased lumbar lordosis  PALPATION: None to palpation.   LOWER EXTREMITY ROM:  Active ROM Right eval Left eval  Hip flexion [facility]  Hip abduction [facility]  Hip internal rotation [facility]  Hip external rotation [facility]  Knee flexion [facility]  Knee extension 95%  WFL  Ankle dorsiflexion [facility] WFL  Ankle plantarflexion WFL WFL   (Blank rows = not tested)  LOWER EXTREMITY MMT:  MMT Right eval Left eval  Hip flexion 5 5  Hip extension 5 5  Hip abduction 4 4  Knee flexion 5 5  Knee extension 5 5  Ankle dorsiflexion 4+ 4+  Ankle inversion 4+ 5  Ankle eversion 4+ 5   (Blank rows = not tested)  12/01/2022:  ankle strength 5/5 bilateral throughout  FUNCTIONAL TESTS:  Eval: 5 times sit to stand: 24.64sec with reports of pain in bilat knees.  Timed up and go (TUG): 16.06 sec   09/15/2022: 3 minute walk:  577 ft  09/28/2022: 5 times sit to stand: 12.4 sec Timed up and go (TUG): 7.32 sec   10/10/2022: Able to ambulate 1,000 ft in 4 min 28 sec.  Patient with some reports of soreness with ambulation.  GAIT: Distance walked: 47ft   Assistive device utilized: None Level of assistance: Complete Independence Comments: Decreased trunk rotation, decreased arm swing, decreased stance length.   BERG  BALANCE TEST Eval: Sitting to Standing: 4.      Stands without using hands and stabilize independently Standing Unsupported: 4.      Stands safely for 2 minutes Sitting Unsupported: 4.     Sits for 2 minutes independently Standing to Sitting: 4.     Sits safely with minimal use of hands Transfers: 4.     Transfers safely with minor use of hands Standing with eyes closed: 4.     Stands safely for 10 seconds  Standing with feet together: 4.     Stands for 1 minute safely Reaching forward with outstretched arm: 4.     Reaches forward 10 inches Retrieving object from the floor: 1.     Unable to pick up and needs supervision Turning to look behind: 3.     Looks behind one side only, other side less weight shift Turning 360 degrees: 3.     Able to turn on one side in </= 4 seconds Place alternate foot on stool: 3.     Completes 8 steps in >20 seconds Standing with one foot in front: 2.     Independent small step for 30 seconds Standing on one foot: 4.     Holds >10 seconds Total Score: 48/56  10/10/2022: BERG 56/56  VESTIBULAR ASSESSMENT: 09/06/2022: Some decreased speed/slowness with tracking, especially towards left side Saccades are WFL Gaze stabilization is WFL Difficult to assess head thrust as pt had difficulty relaxing (but pt does report dizziness with fast head motions at times) [name] positive on each side with symptoms approx 10 sec    TODAY'S TREATMENT:                                                                                                                               DATE: 12/01/2022 Nustep level 5 x6 min with PT present to discuss status Standing on round side of bosu with reaching towards various targets on wall x15 Alt LE toe tapping to 9" stool x10, then alt toe tapping with concurrent hand touching a target x10 bilat Standing on bottom step performing single leg heel lowering 2x10 bilat Seated modified dead lift with 5# x10, then single UE deadlift with 10#  x10, then single RUE deadlift with 15# x10 Squats to 20 inch mat 2x10 Standing forward T at barre for support 2x10 bilat Seated on blue pball marching and LAQ with cuing for core stability 2x10 each bilat Leg Press (  seat at 9):  90# x10, 110# 2x15   DATE: 11/28/2022 Nustep level 5 x6 min with PT present to discuss status Fwd Tandem gait on airex beam x 4 laps, head turns and then head nods x2 laps each Bwd tandem gait on airex beam x4 laps Side stepping with horizontal head turns x4 laps Ambulation over various objects for obstacle course 4x10 ft Standing forward T with occasional UE support of parallel bars 2x15 FWD step ups onto Power Plate 5D66 Side step ups onto Power Plate Y40 bilat with cuing for more eccentric muscle control Standing hamstring stretch with stretch function of Power Plate 30Hz  x30 sec bilat Mini squats on strengthen function on power plate at 35 Hz 3K74 sec Side lunges onto Bosu x10 bilat FWD steps ups on bosu x10 bilat   DATE: 11/21/2022 Recumbent bike L2 x 5 min Fwd Tandem gait on airex beam x 4 laps, head turns and then head nods x2 laps each Bwd tandem gait on airex beam x4 laps Side stepping on airex beam x 4 laps, head turns and then head nods x2 laps each Standing on airex beam, tap fwd on cone 2x10, tap to the side 2x10 Rockerboard fwd/bwd 2x10, static standing 2x30 sec Rockerboard L<>R 2x10, static standing 2x30 sec On blue side of bosu Single leg heel raise 2x10 Single leg stance 3x10 sec Forward T x10 bilat     PATIENT EDUCATION:  Education details: Issued HEP Person educated: Patient Education method: Explanation Education comprehension: verbalized understanding  HOME EXERCISE PROGRAM: Access Code: [email or web address] URL: [email or web address] Date: 10/12/2022 Prepared by: Clydie Braun Jomaira Darr  Exercises - Pencil Pushups  - 1 x daily - 7 x weekly - 2 sets - 10 reps - Seated Horizontal Smooth Pursuit  - 1 x daily - 7 x weekly - 1 sets -  26 reps - Seated Gaze Stabilization with Head Rotation  - 1 x daily - 7 x weekly - 1 sets - 20 reps - Seated Gaze Stabilization with Head Nod  - 1 x daily - 7 x weekly - 1 sets - 20 reps - Brandt-Daroff Vestibular Exercise  - 1 x daily - 7 x weekly - 1 sets - 3 reps - Tandem Walking with Counter Support  - 1 x daily - 7 x weekly - 2 sets - 10 reps - Single Leg Stance with Support  - 1 x daily - 7 x weekly - 1 sets - 2 reps - 20 sec hold - Sit to Stand with Arms Crossed  - 1 x daily - 7 x weekly - 2 sets - 10 reps - Self-Epley Maneuver Left Ear  - 1 x daily - 7 x weekly - 1 sets - 2 reps  Patient Education - BPPV   ASSESSMENT:  CLINICAL IMPRESSION:  Today's session targeted global lower extremity strengthening and dynamic balance activities. Patient showed remarkable improvement in activity tolerance and increased balance fulfilling all interventions with very little need for therapeutic rest during single leg activities, dual tasking, and minimal cuing for proper form. Patient continues to report no dizziness or lightheadedness during activities with positional changes and when head is below heart during seated dead lifts and standing forward Ts. Patient demonstrated increased stability in the ankles during eccentric calf raises and bosu ball with reaching out of base of support by implementing improved neuromuscular control and muscle recruitment required for stability Patient also exhibited increased strength in all ranges of motion of the ankle with no report of pain. Patient has  met all short term and long term goals that allow the patient full participation in activity limitations, ADLs, IADLs, and work related requirements. Patient discharged on 12/01/22 and will follow up with primary care physician as needed.   OBJECTIVE IMPAIRMENTS: decreased activity tolerance, difficulty walking, decreased balance, decreased endurance, decreased mobility, decreased ROM, decreased strength, impaired  flexibility, impaired UE/LE use, postural dysfunction, and pain.  ACTIVITY LIMITATIONS: bending, lifting, carry, locomotion, cleaning, community activity, driving, and or occupation  PERSONAL FACTORS: HTN, frequent falls are also affecting patient's functional outcome.  REHAB POTENTIAL: Good  CLINICAL DECISION MAKING: Stable/uncomplicated  EVALUATION COMPLEXITY: Moderate    GOALS: Short term PT Goals Target date: 09/13/2022  Pt will be I and compliant with HEP. Baseline:  Goal status: MET  Long term PT goals Target date: 12/02/2022 Pt will improve ankle strength to at least 5-/5 MMT to improve functional strength Baseline: Goal status: MET  Pt will improve BERG by at least 3 points in order to demonstrate clinically significant improvement in balance.  Baseline:  48/56 Goal status: MET on 10/10/2022  Pt will reduce pain by overall 50% overall with usual activity Baseline: Goal status: MET  Pt will improve TUG by 3.4 seconds or Minimal clinically important difference.  Baseline:  16.06 sec Goal status: MET on 09/28/2022  Pt will improve his STS by 2.3 seconds for Minimal clinically important difference.  Baseline:  24.64 sec Goal status: MET on 09/28/2022  Pt will be able to ambulate community distances at least 1000 ft WNL gait pattern without complaints Baseline: Goal status: MET         7. Pt will report no dizziness with positional changes.  Baseline: Goal status: MET on 11/14/22 (has had no dizziness)         8. Pt will deny any new falls during 2 weeks leading to discharge.  Baseline: Goal status: MET   PLAN: PT FREQUENCY: 1-2 times per week   PT DURATION: 8 weeks  PLANNED INTERVENTIONS (unless contraindicated): aquatic PT, Canalith repositioning, cryotherapy, Electrical stimulation, Iontophoresis with 4 mg/ml dexamethasome, Moist heat, traction, Ultrasound, gait training, Therapeutic exercise, balance training, neuromuscular re-education, patient/family  education, manual techniques, passive ROM, dry needling, taping, vasopnuematic device, vestibular, spinal manipulations, joint manipulations   PHYSICAL THERAPY DISCHARGE SUMMARY  Patient agrees to discharge. Patient goals were met. Patient is being discharged due to meeting the stated rehab goals.      Michaeljoseph Revolorio, PT 12/01/2022, 3:09 PM  Ailene Ards, SPT present throughout visit  [facility] [address] [place], [place] [address] Phone # [phone] Fax [phone]

## 2023-01-27 DIAGNOSIS — H524 Presbyopia: Secondary | ICD-10-CM | POA: Diagnosis not present

## 2023-01-27 DIAGNOSIS — H52223 Regular astigmatism, bilateral: Secondary | ICD-10-CM | POA: Diagnosis not present

## 2023-01-27 DIAGNOSIS — Z961 Presence of intraocular lens: Secondary | ICD-10-CM | POA: Diagnosis not present

## 2023-01-27 DIAGNOSIS — H5203 Hypermetropia, bilateral: Secondary | ICD-10-CM | POA: Diagnosis not present

## 2023-03-09 ENCOUNTER — Encounter: Payer: Self-pay | Admitting: Rehabilitative and Restorative Service Providers"

## 2023-03-09 ENCOUNTER — Ambulatory Visit: Payer: Medicare HMO | Attending: Family Medicine | Admitting: Rehabilitative and Restorative Service Providers"

## 2023-03-09 ENCOUNTER — Other Ambulatory Visit: Payer: Self-pay

## 2023-03-09 DIAGNOSIS — R293 Abnormal posture: Secondary | ICD-10-CM | POA: Insufficient documentation

## 2023-03-09 DIAGNOSIS — R296 Repeated falls: Secondary | ICD-10-CM | POA: Diagnosis not present

## 2023-03-09 DIAGNOSIS — M5459 Other low back pain: Secondary | ICD-10-CM | POA: Diagnosis not present

## 2023-03-09 DIAGNOSIS — M6281 Muscle weakness (generalized): Secondary | ICD-10-CM | POA: Diagnosis not present

## 2023-03-09 DIAGNOSIS — R2689 Other abnormalities of gait and mobility: Secondary | ICD-10-CM | POA: Diagnosis not present

## 2023-03-09 DIAGNOSIS — R42 Dizziness and giddiness: Secondary | ICD-10-CM | POA: Diagnosis not present

## 2023-03-09 NOTE — Therapy (Signed)
OUTPATIENT PHYSICAL THERAPY VESTIBULAR EVALUATION     Patient Name: Kent Watts MRN: [id number] DOB:[birth year], 72 y.o., male Today's Date: 03/09/2023  END OF SESSION:  PT End of Session - 03/09/23 1241     Visit Number 1    Date for PT Re-Evaluation 05/05/23    Authorization Type [employer]    Progress Note Due on Visit 10    PT Start Time 1234    PT Stop Time 1315    PT Time Calculation (min) 41 min    Activity Tolerance Patient tolerated treatment well    Behavior During Therapy WFL for tasks assessed/performed             Past Medical History:  Diagnosis Date   Hypertension    Past Surgical History:  Procedure Laterality Date   KNEE SURGERY Right    There are no problems to display for this patient.    REFERRING PROVIDER: Odis Luster, PA-C  REFERRING DIAG: R29.6 (ICD-10-CM) - Frequent falls  THERAPY DIAG:  Dizziness and giddiness - Plan: PT plan of care cert/re-cert  Abnormal posture - Plan: PT plan of care cert/re-cert  Other abnormalities of gait and mobility - Plan: PT plan of care cert/re-cert  Muscle weakness (generalized) - Plan: PT plan of care cert/re-cert  Other low back pain - Plan: PT plan of care cert/re-cert  ONSET DATE: 5 weeks ago  Rationale for Evaluation and Treatment: Rehabilitation  SUBJECTIVE:   SUBJECTIVE STATEMENT: Pt reports that he started noticing that his dizziness was getting worse approximately 5 weeks ago, but it has gotten worse over the past few weeks.  Patient reports dizziness with lying down or looking up.  Pt accompanied by: self  PERTINENT HISTORY: HTN, cataract surgery  PAIN:  Are you having pain? Yes: NPRS scale: 5/10 Pain location: low back and right shoulder Pain description: aching and throbbing Aggravating factors: certain positions and worse first thing in the AM Relieving factors: unknown  PRECAUTIONS: Fall  RED FLAGS: None   WEIGHT BEARING RESTRICTIONS: No  FALLS: Has patient  fallen in last 6 months? Yes. 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  Is trying not to do too much moving of items, and Leisure: spending time with granddaughter  PATIENT GOALS: To improve balance and decrease dizziness.  OBJECTIVE:  Note: Objective measures were completed at Evaluation unless otherwise noted.  DIAGNOSTIC FINDINGS:  Lumbar Radiograph on 03/30/2022: IMPRESSION: 1. No acute fracture. 2. 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 3. Nonobstructive right renal calculus.  Cervical CT Scan on 03/29/2022: IMPRESSION: CT of the head: Small focus of hemorrhage along the falx just to the left of the midline CT of the cervical spine: Multilevel degenerative change without acute abnormality.  COGNITION: Overall cognitive status: Within functional limits for tasks assessed   SENSATION: Reports some numbness/tingling in bilat feet, especially at night  POSTURE:  rounded shoulders and forward head  Cervical ROM:    Eval:  limited 25% with pain and dizziness when looking up  STRENGTH:  Eval: Bilat UE strength is WFL Bilat quad strength is WFL Bilateral hip strength is 4/5 grossly throughout  FUNCTIONAL TESTS:  Eval:   5 times sit to stand: 16.43 sec MCTSIB: Condition 1: Avg of 3 trials: 30 sec, Condition 2: Avg of 3 trials: 30 sec, Condition 3: Avg of 3 trials: 30 sec, Condition 4: Avg of 3 trials: 30 sec, and Total Score: 120/120 Single Leg Stance:  Right- 7 sec, Left 11 sec   VESTIBULAR ASSESSMENT:  SYMPTOM BEHAVIOR:  Non-Vestibular symptoms: neck pain  Type of dizziness:  Spinning/Vertigo, Unsteady with head/body turns, and "World moves"  Frequency: can happen daily, some days worse than others  Duration: varies  Aggravating factors: Induced by position change: lying supine and rolling to the right and Induced by motion: looking up at the ceiling and turning head quickly  Relieving factors: head stationary  Progression of symptoms: worse  OCULOMOTOR EXAM:  Ocular Alignment: normal  Ocular ROM: No Limitations  Spontaneous Nystagmus: absent  Gaze-Induced Nystagmus: absent  Smooth Pursuits: intact  Saccades: slow    VESTIBULAR - OCULAR REFLEX:   Head Thrust positive for nystagmus   POSITIONAL TESTING: Right Dix-Hallpike: nystagmus noted and Duration: 1 min    VESTIBULAR TREATMENT:                                                                                                   DATE: 03/09/2023  Canalith Repositioning:  Epley Right: Number of Reps: 1 and Response to Treatment: symptoms improved   PATIENT EDUCATION: Education details: Issued HEP Person educated: Patient Education method: Explanation, Demonstration, and Handouts Education comprehension: verbalized understanding  HOME EXERCISE PROGRAM: Access Code: [email or web address] URL: [email or web address] Date: 03/09/2023 Prepared by: Clydie Braun Camren Lipsett  Exercises - Pencil Pushups  - 1 x daily - 7 x weekly - 2 sets - 10 reps - Seated Gaze Stabilization with Head Rotation  - 1 x daily - 7 x weekly - 1 sets - 20 reps - Seated Gaze Stabilization with Head Nod  - 1 x daily - 7 x weekly - 1 sets - 20 reps - Brandt-Daroff Vestibular Exercise  - 1 x daily - 7 x weekly - 1 sets - 3 reps - Tandem Walking with Counter Support  - 1 x daily - 7 x weekly - 2 sets - 10 reps - Single Leg Stance with Support  - 1 x daily - 7 x weekly - 1 sets - 2 reps - 20 sec hold - Sit to Stand with Arms Crossed  - 1 x daily - 7 x weekly - 2 sets - 10 reps  GOALS: Goals reviewed with patient? Yes  SHORT TERM GOALS:  Target date: 03/31/2023  Patient will be independent with initial HEP. Baseline: Goal status: INITIAL  2.  Patient will report at least a 25% improvement in dizziness symptoms. Baseline:  Goal status: INITIAL   LONG TERM GOALS: Target date: 05/05/2023  Patient will be independent with advanced HEP to allow for self progression after discharge. Baseline:  Goal status: INITIAL  2.  Patient will report at least a 75% improvement in dizziness to allow him to perform functional tasks without increased dizziness. Baseline:  Goal status: INITIAL  3.  Patient will increase bilat hip strength to at least 4+/5 to allow him to perform functional tasks with improve ease. Baseline: 4/5 Goal status: INITIAL  4.  Patient will report pain of no increase in back pain with various typical household tasks and with community ambulation. Baseline:  Goal status: INITIAL  5.  Patient will report no falls or loss in balance within 2 weeks leading up to discharge. Baseline:  Goal status: INITIAL   ASSESSMENT:  CLINICAL IMPRESSION: Patient is a 72 y.o. male who was seen today for physical therapy evaluation and treatment for frequent falls. Patient is known to this PT from earlier in the year.  Patient presents with some increased dizziness, back pain, muscle weakness, decreased balance and history of falls.  Patient does state that he has not had any new falls since discharge from PT, but he has been trying to be more careful.  Patient would benefit from skilled PT to address his functional impairments to allow him to be safer and decrease his dizziness and risk of falling and decrease his pain.  OBJECTIVE IMPAIRMENTS: Abnormal gait, decreased balance, decreased strength, increased muscle spasms, postural dysfunction, and pain.   ACTIVITY LIMITATIONS: carrying, lifting, bending, stairs, and bed mobility  PARTICIPATION LIMITATIONS: driving and community activity  PERSONAL FACTORS: Past/current  experiences, Time since onset of injury/illness/exacerbation, and 3 comorbidities: HTN, cataract surgery, Hx of back pain are also affecting patient's functional outcome.   REHAB POTENTIAL: Good  CLINICAL DECISION MAKING: Evolving/moderate complexity  EVALUATION COMPLEXITY: Moderate   PLAN:  PT FREQUENCY: 2x/week  PT DURATION: 8 weeks  PLANNED INTERVENTIONS: Therapeutic exercises, Therapeutic activity, Neuromuscular re-education, Balance training, Gait training, Patient/Family education, Self Care, Joint mobilization, Joint manipulation, Stair training, Vestibular training, Canalith repositioning, Aquatic Therapy, Dry Needling, Electrical stimulation, Spinal manipulation, Spinal mobilization, Cryotherapy, Moist heat, Taping, Traction, Ultrasound, Manual therapy, and Re-evaluation  PLAN FOR NEXT SESSION: Assess and progress HEP as indicated, vestibular rehab, strengthening, canalith repositioning as indicated   Reather Laurence, PT, DPT 03/09/23, 1:34 PM  [facility] [address] [place], [place] [address] Phone # [phone] Fax [phone]

## 2023-03-14 ENCOUNTER — Ambulatory Visit: Payer: Medicare HMO | Admitting: Rehabilitative and Restorative Service Providers"

## 2023-03-14 ENCOUNTER — Encounter: Payer: Self-pay | Admitting: Rehabilitative and Restorative Service Providers"

## 2023-03-14 DIAGNOSIS — R2689 Other abnormalities of gait and mobility: Secondary | ICD-10-CM

## 2023-03-14 DIAGNOSIS — R296 Repeated falls: Secondary | ICD-10-CM | POA: Diagnosis not present

## 2023-03-14 DIAGNOSIS — R42 Dizziness and giddiness: Secondary | ICD-10-CM

## 2023-03-14 DIAGNOSIS — M6281 Muscle weakness (generalized): Secondary | ICD-10-CM

## 2023-03-14 DIAGNOSIS — R293 Abnormal posture: Secondary | ICD-10-CM

## 2023-03-14 DIAGNOSIS — M5459 Other low back pain: Secondary | ICD-10-CM | POA: Diagnosis not present

## 2023-03-14 NOTE — Therapy (Signed)
OUTPATIENT PHYSICAL THERAPY VESTIBULAR TREATMENT NOTE     Patient Name: Francois Elk MRN: [id number] DOB:07-04-1950, 72 y.o., male Today's Date: 03/14/2023  END OF SESSION:  PT End of Session - 03/14/23 1104     Visit Number 2    Date for PT Re-Evaluation 05/05/23    Authorization Type [employer]    Progress Note Due on Visit 10    PT Start Time 1100    PT Stop Time 1140    PT Time Calculation (min) 40 min    Activity Tolerance Patient tolerated treatment well    Behavior During Therapy WFL for tasks assessed/performed             Past Medical History:  Diagnosis Date   Hypertension    Past Surgical History:  Procedure Laterality Date   KNEE SURGERY Right    There are no problems to display for this patient.    REFERRING PROVIDER: Odis Luster, PA-C  REFERRING DIAG: R29.6 (ICD-10-CM) - Frequent falls  THERAPY DIAG:  Dizziness and giddiness  Abnormal posture  Other abnormalities of gait and mobility  Muscle weakness (generalized)  Other low back pain  ONSET DATE: 5 weeks ago  Rationale for Evaluation and Treatment: Rehabilitation  SUBJECTIVE:   SUBJECTIVE STATEMENT: Pt reports that in general, it does appear that his dizziness is a little better.  Pt accompanied by: self  PERTINENT HISTORY: HTN, cataract surgery  PAIN:  Are you having pain? Yes: NPRS scale: 0-4/10 Pain location: low back and right shoulder Pain description: aching and throbbing Aggravating factors: certain positions and worse first thing in the AM Relieving factors: unknown  PRECAUTIONS: Fall  RED FLAGS: None   WEIGHT BEARING RESTRICTIONS: No  FALLS: Has patient fallen in last 6 months? Yes. 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  Is trying not to do too much moving of items, and Leisure: spending time with granddaughter  PATIENT GOALS: To improve balance and decrease dizziness.  OBJECTIVE:  Note: Objective measures were completed at Evaluation unless otherwise noted.  DIAGNOSTIC FINDINGS:  Lumbar Radiograph on 03/30/2022: IMPRESSION: 1. No acute fracture. 2. 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 3. Nonobstructive right renal calculus.  Cervical CT Scan on 03/29/2022: IMPRESSION: CT of the head: Small focus of hemorrhage along the falx just to the left of the midline CT of the cervical spine: Multilevel degenerative change without acute abnormality.  COGNITION: Overall cognitive status: Within functional limits for tasks assessed   SENSATION: Reports some numbness/tingling in bilat feet, especially at night  POSTURE:  rounded shoulders and forward head  Cervical ROM:    Eval:  limited 25% with pain and dizziness when looking up  STRENGTH:  Eval: Bilat UE strength is WFL Bilat quad strength is WFL Bilateral hip strength is 4/5 grossly throughout   FUNCTIONAL TESTS:  Eval:   5 times sit to stand: 16.43 sec MCTSIB: Condition 1: Avg of 3 trials: 30 sec, Condition 2: Avg of 3 trials: 30 sec, Condition 3: Avg of 3 trials: 30 sec, Condition 4: Avg of 3 trials: 30 sec, and Total Score: 120/120 Single Leg Stance:  Right- 7 sec, Left 11 sec   VESTIBULAR ASSESSMENT:  SYMPTOM BEHAVIOR:  Non-Vestibular symptoms: neck pain  Type of dizziness: Spinning/Vertigo, Unsteady with head/body turns, and "World moves"  Frequency: can happen daily, some days worse than others  Duration: varies  Aggravating factors: Induced by position change: lying supine and rolling to the right and Induced by motion: looking up at the ceiling and turning head  quickly  Relieving factors: head stationary  Progression of symptoms: worse  OCULOMOTOR EXAM:  Ocular Alignment: normal  Ocular ROM: No Limitations  Spontaneous Nystagmus: absent  Gaze-Induced Nystagmus: absent  Smooth Pursuits: intact  Saccades: slow    VESTIBULAR - OCULAR REFLEX:   Head Thrust positive for nystagmus   POSITIONAL TESTING: Right Dix-Hallpike: nystagmus noted and Duration: 1 min    VESTIBULAR TREATMENT:                                                                                                    DATE: 03/14/2023 Nustep level 5 x6 min with PT present to discuss status Dix Hallpike positive on left side, proceeded to canalith repositioning with Epley Maneuver x3 Tandem gait at counter for balance, as needed, 6x8 ft Side stepping at counter for balance, as needed, down and back x3 each Standing in parallel bars on purple foam:  hip abduction and hip extension.  2x10 each bilat Standing rocker board for DF/PF x1 min   DATE: 03/09/2023  Canalith Repositioning:  Epley Right: Number of Reps: 1 and Response to Treatment: symptoms improved   PATIENT EDUCATION: Education details: Issued HEP Person educated: Patient Education method: Explanation, Demonstration, and Handouts Education comprehension: verbalized understanding  HOME EXERCISE PROGRAM: Access Code: [email or web address] URL: [email or web address] Date: 03/09/2023 Prepared by: Clydie Braun Gladstone Rosas  Exercises - Pencil Pushups  - 1 x daily - 7 x weekly - 2 sets - 10 reps - Seated Gaze Stabilization with Head Rotation  - 1 x daily - 7 x weekly - 1 sets - 20 reps - Seated Gaze Stabilization with Head Nod  - 1 x daily - 7 x weekly - 1 sets - 20 reps - Brandt-Daroff Vestibular Exercise  - 1 x daily - 7 x weekly - 1 sets - 3 reps - Tandem Walking with Counter Support  - 1 x daily - 7 x weekly - 2 sets - 10 reps - Single Leg Stance with Support  - 1 x daily - 7 x weekly - 1 sets - 2 reps - 20 sec hold - Sit  to Stand with Arms Crossed  - 1 x daily - 7 x weekly - 2 sets - 10 reps  GOALS: Goals reviewed with patient? Yes  SHORT TERM GOALS: Target date: 03/31/2023  Patient will be independent with initial HEP. Baseline: Goal status: IN PROGRESS  2.  Patient will report at least a 25% improvement in dizziness symptoms. Baseline:  Goal status: IN PROGRESS   LONG TERM GOALS: Target date: 05/05/2023  Patient will be independent with advanced HEP to allow for self progression after discharge. Baseline:  Goal status: INITIAL  2.  Patient will  report at least a 75% improvement in dizziness to allow him to perform functional tasks without increased dizziness. Baseline:  Goal status: INITIAL  3.  Patient will increase bilat hip strength to at least 4+/5 to allow him to perform functional tasks with improve ease. Baseline: 4/5 Goal status: INITIAL  4.  Patient will report pain of no increase in back pain with various typical household tasks and with community ambulation. Baseline:  Goal status: INITIAL  5.  Patient will report no falls or loss in balance within 2 weeks leading up to discharge. Baseline:  Goal status: INITIAL   ASSESSMENT:  CLINICAL IMPRESSION: Mr [place] presents to skilled PT reporting that his dizziness may be some improved.  Patient with positive [name] on left side, so proceeded with canalith repositioning with Epley Maneuver x3 reps.  Patient with decreased nystagmus noted each subsequent maneuver.  Patient able to progress with balance exercises following canalith repositioning, but did not perform any head VOR secondary to just performing canalith repositioning.  Patient continues to require skilled PT to progress towards goal related activities and decreased dizziness.  OBJECTIVE IMPAIRMENTS: Abnormal gait, decreased balance, decreased strength, increased muscle spasms, postural dysfunction, and pain.   ACTIVITY LIMITATIONS: carrying, lifting, bending,  stairs, and bed mobility  PARTICIPATION LIMITATIONS: driving and community activity  PERSONAL FACTORS: Past/current experiences, Time since onset of injury/illness/exacerbation, and 3 comorbidities: HTN, cataract surgery, Hx of back pain are also affecting patient's functional outcome.   REHAB POTENTIAL: Good  CLINICAL DECISION MAKING: Evolving/moderate complexity  EVALUATION COMPLEXITY: Moderate   PLAN:  PT FREQUENCY: 2x/week  PT DURATION: 8 weeks  PLANNED INTERVENTIONS: Therapeutic exercises, Therapeutic activity, Neuromuscular re-education, Balance training, Gait training, Patient/Family education, Self Care, Joint mobilization, Joint manipulation, Stair training, Vestibular training, Canalith repositioning, Aquatic Therapy, Dry Needling, Electrical stimulation, Spinal manipulation, Spinal mobilization, Cryotherapy, Moist heat, Taping, Traction, Ultrasound, Manual therapy, and Re-evaluation  PLAN FOR NEXT SESSION: Assess and progress HEP as indicated, vestibular rehab, strengthening, canalith repositioning as indicated   Reather Laurence, PT, DPT 03/14/23, 11:44 AM  [facility] [address] [place], [place] [address] Phone # [phone] Fax [phone]

## 2023-03-20 ENCOUNTER — Encounter: Payer: Self-pay | Admitting: Rehabilitative and Restorative Service Providers"

## 2023-03-20 ENCOUNTER — Ambulatory Visit: Payer: Medicare HMO | Admitting: Rehabilitative and Restorative Service Providers"

## 2023-03-20 DIAGNOSIS — R293 Abnormal posture: Secondary | ICD-10-CM | POA: Diagnosis not present

## 2023-03-20 DIAGNOSIS — M5459 Other low back pain: Secondary | ICD-10-CM

## 2023-03-20 DIAGNOSIS — M6281 Muscle weakness (generalized): Secondary | ICD-10-CM

## 2023-03-20 DIAGNOSIS — R2689 Other abnormalities of gait and mobility: Secondary | ICD-10-CM | POA: Diagnosis not present

## 2023-03-20 DIAGNOSIS — R42 Dizziness and giddiness: Secondary | ICD-10-CM | POA: Diagnosis not present

## 2023-03-20 DIAGNOSIS — R296 Repeated falls: Secondary | ICD-10-CM | POA: Diagnosis not present

## 2023-03-20 NOTE — Therapy (Signed)
OUTPATIENT PHYSICAL THERAPY VESTIBULAR TREATMENT NOTE     Patient Name: Kent Watts MRN: [id number] DOB:09-15-1950, 72 y.o., male Today's Date: 03/20/2023  END OF SESSION:  PT End of Session - 03/20/23 1036     Visit Number 3    Date for PT Re-Evaluation 05/05/23    Authorization Type [employer]    Progress Note Due on Visit 10    PT Start Time 1031    PT Stop Time 1110    PT Time Calculation (min) 39 min    Activity Tolerance Patient tolerated treatment well    Behavior During Therapy WFL for tasks assessed/performed             Past Medical History:  Diagnosis Date   Hypertension    Past Surgical History:  Procedure Laterality Date   KNEE SURGERY Right    There are no problems to display for this patient.    REFERRING PROVIDER: Odis Luster, PA-C  REFERRING DIAG: R29.6 (ICD-10-CM) - Frequent falls  THERAPY DIAG:  Dizziness and giddiness  Abnormal posture  Other abnormalities of gait and mobility  Muscle weakness (generalized)  Other low back pain  ONSET DATE: 5 weeks ago  Rationale for Evaluation and Treatment: Rehabilitation  SUBJECTIVE:   SUBJECTIVE STATEMENT: Pt reports similar dizziness to last session.  Denies pain back pain, but states some right shoulder pain.  Pt accompanied by: self  PERTINENT HISTORY: HTN, cataract surgery  PAIN:  Are you having pain? Yes: NPRS scale: 0-4/10 Pain location: right shoulder Pain description: aching and throbbing Aggravating factors: certain positions and worse first thing in the AM Relieving factors: unknown  PRECAUTIONS: Fall  RED FLAGS: None   WEIGHT BEARING RESTRICTIONS: No  FALLS: Has patient fallen in last 6 months? Yes. 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  Is trying not to do too much moving of items, and Leisure: spending time with granddaughter  PATIENT GOALS: To improve balance and decrease dizziness.  OBJECTIVE:  Note: Objective measures were completed at Evaluation unless otherwise noted.  DIAGNOSTIC FINDINGS:  Lumbar Radiograph on 03/30/2022: IMPRESSION: 1. No acute fracture. 2. 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 3. Nonobstructive right renal calculus.  Cervical CT Scan on 03/29/2022: IMPRESSION: CT of the head: Small focus of hemorrhage along the falx just to the left of the midline CT of the cervical spine: Multilevel degenerative change without acute abnormality.  COGNITION: Overall cognitive status: Within functional limits for tasks assessed   SENSATION: Reports some numbness/tingling in bilat feet, especially at night  POSTURE:  rounded shoulders and forward head  Cervical ROM:    Eval:  limited 25% with pain and dizziness when looking up  STRENGTH:  Eval: Bilat UE strength is WFL Bilat quad strength is WFL Bilateral hip strength is 4/5 grossly throughout   FUNCTIONAL TESTS:  Eval:   5 times sit to stand: 16.43 sec MCTSIB: Condition 1: Avg of 3 trials: 30 sec, Condition 2: Avg of 3 trials: 30 sec, Condition 3: Avg of 3 trials: 30 sec, Condition 4: Avg of 3 trials: 30 sec, and Total Score: 120/120 Single Leg Stance:  Right- 7 sec, Left 11 sec   VESTIBULAR ASSESSMENT:  SYMPTOM BEHAVIOR:  Non-Vestibular symptoms: neck pain  Type of dizziness: Spinning/Vertigo, Unsteady with head/body turns, and "World moves"  Frequency: can happen daily, some days worse than others  Duration: varies  Aggravating factors: Induced by position change: lying supine and rolling to the right and Induced by motion: looking up at the ceiling and turning head  quickly  Relieving factors: head stationary  Progression of symptoms: worse  OCULOMOTOR EXAM:  Ocular Alignment: normal  Ocular ROM: No Limitations  Spontaneous Nystagmus: absent  Gaze-Induced Nystagmus: absent  Smooth Pursuits: intact  Saccades: slow    VESTIBULAR - OCULAR REFLEX:   Head Thrust positive for nystagmus   POSITIONAL TESTING: Right Dix-Hallpike: nystagmus noted and Duration: 1 min    VESTIBULAR TREATMENT:                                                                                                    DATE:  03/20/2023: Nustep level 5 x5 min with PT present to discuss status Dix Hallpike positive on the right side, proceeded to canalith repositioning with Epley Maneuver x2 Dix Hallpike positive on left side, proceeded to canalith repositioning with Epley Maneuver x2 Pencil push-ups 2x10 Standing rocker board for DF/PF x2 min Standing with one foot on each lateral side of rocker board performing static balance x1 min Heel tap down from 2" step with UE support x10 bilat   03/14/2023 Nustep level 5 x6 min with PT present to discuss status Dix Hallpike positive on left side, proceeded to canalith repositioning with Epley Maneuver x3 Tandem gait at counter for balance, as needed, 6x8 ft Side stepping at counter for balance, as needed, down and back x3 each Standing in parallel bars on purple foam:  hip abduction and hip extension.  2x10 each bilat Standing rocker board for DF/PF x1 min     PATIENT EDUCATION: Education details: Issued HEP Person educated: Patient Education method: Explanation, Demonstration, and Handouts Education comprehension: verbalized understanding  HOME EXERCISE PROGRAM: Access Code: [email or web address] URL: [email or web address] Date: 03/09/2023 Prepared by: Clydie Braun Johnnetta Holstine  Exercises - Pencil Pushups  - 1 x daily - 7 x weekly - 2 sets - 10 reps - Seated Gaze Stabilization with Head Rotation  - 1 x daily - 7 x weekly - 1 sets  - 20 reps - Seated Gaze Stabilization with Head Nod  - 1 x daily - 7 x weekly - 1 sets - 20 reps - Brandt-Daroff Vestibular Exercise  - 1 x daily - 7 x weekly - 1 sets - 3 reps - Tandem Walking with Counter Support  - 1 x daily - 7 x weekly - 2 sets - 10 reps - Single Leg Stance with Support  - 1 x daily - 7 x weekly - 1 sets - 2 reps - 20 sec hold - Sit to Stand with Arms Crossed  - 1 x daily - 7 x weekly - 2 sets - 10 reps  GOALS: Goals reviewed with patient? Yes  SHORT TERM GOALS: Target date: 03/31/2023  Patient will  be independent with initial HEP. Baseline: Goal status: MET on 03/21/2023  2.  Patient will report at least a 25% improvement in dizziness symptoms. Baseline:  Goal status: IN PROGRESS   LONG TERM GOALS: Target date: 05/05/2023  Patient will be independent with advanced HEP to allow for self progression after discharge. Baseline:  Goal status: INITIAL  2.  Patient will report at least a 75% improvement in dizziness to allow him to perform functional tasks without increased dizziness. Baseline:  Goal status: INITIAL  3.  Patient will increase bilat hip strength to at least 4+/5 to allow him to perform functional tasks with improve ease. Baseline: 4/5 Goal status: INITIAL  4.  Patient will report pain of no increase in back pain with various typical household tasks and with community ambulation. Baseline:  Goal status: INITIAL  5.  Patient will report no falls or loss in balance within 2 weeks leading up to discharge. Baseline:  Goal status: INITIAL   ASSESSMENT:  CLINICAL IMPRESSION: Mr Dieppa presents to skilled PT reporting that he is still having dizziness.  Patient with positive [name] on bilateral sides.  Patient able to progress with canalith repositioning and with lessening symptoms each subsequent trial.  Patient able to progress with balance exercises after this.  Patient with some dizziness at end of session and had to take a seated  recovery period before he left clinic.  Cuing to patient to keep eyes open during dizziness to allow the visual component of balance to assist in recalibrating patient.    OBJECTIVE IMPAIRMENTS: Abnormal gait, decreased balance, decreased strength, increased muscle spasms, postural dysfunction, and pain.   ACTIVITY LIMITATIONS: carrying, lifting, bending, stairs, and bed mobility  PARTICIPATION LIMITATIONS: driving and community activity  PERSONAL FACTORS: Past/current experiences, Time since onset of injury/illness/exacerbation, and 3 comorbidities: HTN, cataract surgery, Hx of back pain are also affecting patient's functional outcome.   REHAB POTENTIAL: Good  CLINICAL DECISION MAKING: Evolving/moderate complexity  EVALUATION COMPLEXITY: Moderate   PLAN:  PT FREQUENCY: 2x/week  PT DURATION: 8 weeks  PLANNED INTERVENTIONS: Therapeutic exercises, Therapeutic activity, Neuromuscular re-education, Balance training, Gait training, Patient/Family education, Self Care, Joint mobilization, Joint manipulation, Stair training, Vestibular training, Canalith repositioning, Aquatic Therapy, Dry Needling, Electrical stimulation, Spinal manipulation, Spinal mobilization, Cryotherapy, Moist heat, Taping, Traction, Ultrasound, Manual therapy, and Re-evaluation  PLAN FOR NEXT SESSION: Assess and progress HEP as indicated, vestibular rehab, strengthening, canalith repositioning as indicated   Reather Laurence, PT, DPT 03/20/23, 11:29 AM  [facility] [address], [address] [place], [place] [address] Phone # [phone] Fax [phone]

## 2023-03-31 ENCOUNTER — Ambulatory Visit: Payer: Medicare HMO | Admitting: Rehabilitative and Restorative Service Providers"

## 2023-03-31 ENCOUNTER — Encounter: Payer: Self-pay | Admitting: Rehabilitative and Restorative Service Providers"

## 2023-03-31 DIAGNOSIS — R296 Repeated falls: Secondary | ICD-10-CM | POA: Diagnosis not present

## 2023-03-31 DIAGNOSIS — R2689 Other abnormalities of gait and mobility: Secondary | ICD-10-CM

## 2023-03-31 DIAGNOSIS — M5459 Other low back pain: Secondary | ICD-10-CM | POA: Diagnosis not present

## 2023-03-31 DIAGNOSIS — R42 Dizziness and giddiness: Secondary | ICD-10-CM | POA: Diagnosis not present

## 2023-03-31 DIAGNOSIS — M6281 Muscle weakness (generalized): Secondary | ICD-10-CM | POA: Diagnosis not present

## 2023-03-31 DIAGNOSIS — R293 Abnormal posture: Secondary | ICD-10-CM

## 2023-03-31 NOTE — Therapy (Signed)
OUTPATIENT PHYSICAL THERAPY VESTIBULAR TREATMENT NOTE     Patient Name: Kent Watts MRN: [id number] DOB:[birth year], 72 y.o., male Today's Date: 03/31/2023  END OF SESSION:  PT End of Session - 03/31/23 0800     Visit Number 4    Date for PT Re-Evaluation 05/05/23    Authorization Type [employer]    Progress Note Due on Visit 10    PT Start Time 0800    PT Stop Time 0840    PT Time Calculation (min) 40 min    Activity Tolerance Patient tolerated treatment well    Behavior During Therapy WFL for tasks assessed/performed             Past Medical History:  Diagnosis Date   Hypertension    Past Surgical History:  Procedure Laterality Date   KNEE SURGERY Right    There are no problems to display for this patient.    REFERRING PROVIDER: Odis Luster, PA-C  REFERRING DIAG: R29.6 (ICD-10-CM) - Frequent falls  THERAPY DIAG:  Dizziness and giddiness  Abnormal posture  Other abnormalities of gait and mobility  Muscle weakness (generalized)  Other low back pain  ONSET DATE: 5 weeks ago  Rationale for Evaluation and Treatment: Rehabilitation  SUBJECTIVE:   SUBJECTIVE STATEMENT: Pt denies current dizziness, states that he did have some dizziness yesterday.  Pt reports that yesterday, he miss-stepped on a raised flooring area in a closet and nearly fell, but caught himself on some boxes, so he did not fall to the ground.  Pt accompanied by: self  PERTINENT HISTORY: HTN, cataract surgery  PAIN:  Are you having pain? Yes: NPRS scale: currently 0/10 but yesterday went up to 4/10 Pain location: right shoulder Pain description: aching and throbbing Aggravating factors: certain positions and worse first thing in the AM Relieving factors: unknown  PRECAUTIONS: Fall  RED FLAGS: None   WEIGHT BEARING RESTRICTIONS: No  FALLS: Has patient fallen in last 6 months? Yes. 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  Is trying not to do too much moving of items, and Leisure: spending time with granddaughter  PATIENT GOALS: To improve balance and decrease dizziness.  OBJECTIVE:  Note: Objective measures were completed at Evaluation unless otherwise noted.  DIAGNOSTIC FINDINGS:  Lumbar Radiograph on 03/30/2022: IMPRESSION: 1. No acute fracture. 2. 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 3. Nonobstructive right renal calculus.  Cervical CT Scan on 03/29/2022: IMPRESSION: CT of the head: Small focus of hemorrhage along the falx just to the left of the midline CT of the cervical spine: Multilevel degenerative change without acute abnormality.  COGNITION: Overall cognitive status: Within functional limits for tasks assessed   SENSATION: Reports some numbness/tingling in bilat feet, especially at night  POSTURE:  rounded shoulders and forward head  Cervical ROM:    Eval:  limited 25% with pain and dizziness when looking up  STRENGTH:  Eval: Bilat UE strength is WFL Bilat quad strength is WFL Bilateral hip strength is 4/5 grossly throughout   FUNCTIONAL TESTS:  Eval:   5 times sit to stand: 16.43 sec MCTSIB: Condition 1: Avg of 3 trials: 30  sec, Condition 2: Avg of 3 trials: 30 sec, Condition 3: Avg of 3 trials: 30 sec, Condition 4: Avg of 3 trials: 30 sec, and Total Score: 120/120 Single Leg Stance:  Right- 7 sec, Left 11 sec   VESTIBULAR ASSESSMENT:  SYMPTOM BEHAVIOR:  Non-Vestibular symptoms: neck pain  Type of dizziness: Spinning/Vertigo, Unsteady with head/body turns, and "World moves"  Frequency: can happen daily, some days worse  than others  Duration: varies  Aggravating factors: Induced by position change: lying supine and rolling to the right and Induced by motion: looking up at the ceiling and turning head quickly  Relieving factors: head stationary  Progression of symptoms: worse  OCULOMOTOR EXAM:  Ocular Alignment: normal  Ocular ROM: No Limitations  Spontaneous Nystagmus: absent  Gaze-Induced Nystagmus: absent  Smooth Pursuits: intact  Saccades: slow    VESTIBULAR - OCULAR REFLEX:   Head Thrust positive for nystagmus   POSITIONAL TESTING: Right Dix-Hallpike: nystagmus noted and Duration: 1 min    VESTIBULAR TREATMENT:                                                                                                    DATE:  03/31/2023: Nustep level 5 x5 min with PT present to discuss status Ambulation down PT gym hallway performing vertical head nods and horizontal head rotations.  X2 laps each Standing on purple foam pad with eyes open x30 sec, with eyes closed  Standing on purple foam pad performing vertical head nods and horizontal head rotations.  2x30 sec each Standing on flat side of bosu in parallel bars 2x1 min Dix Hallpike positive on the right side for 15 sec,  proceeded to canalith repositioning with Epley Maneuver x2   03/20/2023: Nustep level 5 x5 min with PT present to discuss status Dix Hallpike positive on the right side, proceeded to canalith repositioning with Epley Maneuver x2 [employer] positive on left side, proceeded to canalith repositioning with Epley Maneuver x2 Pencil push-ups 2x10 Standing rocker board for DF/PF x2 min Standing with one foot on each lateral side of rocker board performing static balance x1 min Heel tap down from 2" step with UE support x10 bilat   03/14/2023 Nustep level 5 x6 min with PT present to discuss status Dix Hallpike positive on left side, proceeded to canalith repositioning with Epley Maneuver x3 Tandem gait at counter for balance, as  needed, 6x8 ft Side stepping at counter for balance, as needed, down and back x3 each Standing in parallel bars on purple foam:  hip abduction and hip extension.  2x10 each bilat Standing rocker board for DF/PF x1 min     PATIENT EDUCATION: Education details: Issued HEP Person educated: Patient Education method: Explanation, Demonstration, and Handouts Education comprehension: verbalized understanding  HOME EXERCISE PROGRAM: Access Code: [email or web address] URL: [email or web address] Date: 03/09/2023 Prepared by: Clydie Braun Janari Yamada  Exercises - Pencil Pushups  - 1 x daily - 7 x weekly - 2 sets - 10 reps - Seated Gaze Stabilization with Head Rotation  - 1 x daily - 7 x weekly - 1 sets - 20 reps -  Seated Gaze Stabilization with Head Nod  - 1 x daily - 7 x weekly - 1 sets - 20 reps - Brandt-Daroff Vestibular Exercise  - 1 x daily - 7 x weekly - 1 sets - 3 reps - Tandem Walking with Counter Support  - 1 x daily - 7 x weekly - 2 sets - 10 reps - Single Leg Stance with Support  - 1 x daily - 7 x weekly - 1 sets - 2 reps - 20 sec hold - Sit to Stand with Arms Crossed  - 1 x daily - 7 x weekly - 2 sets - 10 reps  GOALS: Goals reviewed with patient? Yes  SHORT TERM GOALS: Target date: 03/31/2023  Patient will be independent with initial HEP. Baseline: Goal status: MET on 03/21/2023  2.  Patient will report at least a 25% improvement in dizziness symptoms. Baseline:  Goal status: IN PROGRESS   LONG TERM GOALS: Target date: 05/05/2023  Patient will be independent with advanced HEP to allow for self progression after discharge. Baseline:  Goal status: IN PROGRESS  2.  Patient will report at least a 75% improvement in dizziness to allow him to perform functional tasks without increased dizziness. Baseline:  Goal status: INITIAL  3.  Patient will increase bilat hip strength to at least 4+/5 to allow him to perform functional tasks with improve ease. Baseline: 4/5 Goal status:  IN PROGRESS  4.  Patient will report pain of no increase in back pain with various typical household tasks and with community ambulation. Baseline:  Goal status: IN PROGRESS  5.  Patient will report no falls or loss in balance within 2 weeks leading up to discharge. Baseline:  Goal status: IN PROGRESS   ASSESSMENT:  CLINICAL IMPRESSION: Mr Mckittrick presents to skilled PT reporting a minor loss of balance yesterday, but denies a fall to the ground.  Patient continues with positive [name] on the right side with initial nystagmus lasting for 15 seconds.  Patient with less symptoms noted on second trial of Epley maneuver.  Patient with less unsteadiness and dizziness reported at completion of visit today compared to last week.  Patient requires close SBA with vestibular/balance exercises, especially the ones with vertical head nods.  OBJECTIVE IMPAIRMENTS: Abnormal gait, decreased balance, decreased strength, increased muscle spasms, postural dysfunction, and pain.   ACTIVITY LIMITATIONS: carrying, lifting, bending, stairs, and bed mobility  PARTICIPATION LIMITATIONS: driving and community activity  PERSONAL FACTORS: Past/current experiences, Time since onset of injury/illness/exacerbation, and 3 comorbidities: HTN, cataract surgery, Hx of back pain are also affecting patient's functional outcome.   REHAB POTENTIAL: Good  CLINICAL DECISION MAKING: Evolving/moderate complexity  EVALUATION COMPLEXITY: Moderate   PLAN:  PT FREQUENCY: 2x/week  PT DURATION: 8 weeks  PLANNED INTERVENTIONS: Therapeutic exercises, Therapeutic activity, Neuromuscular re-education, Balance training, Gait training, Patient/Family education, Self Care, Joint mobilization, Joint manipulation, Stair training, Vestibular training, Canalith repositioning, Aquatic Therapy, Dry Needling, Electrical stimulation, Spinal manipulation, Spinal mobilization, Cryotherapy, Moist heat, Taping, Traction, Ultrasound, Manual  therapy, and Re-evaluation  PLAN FOR NEXT SESSION: Assess and progress HEP as indicated, vestibular rehab, strengthening, canalith repositioning as indicated   Reather Laurence, PT, DPT 03/31/23, 9:20 AM  [facility] [address] [place], [place] [address] Phone # [phone] Fax [phone]

## 2023-04-04 ENCOUNTER — Encounter: Payer: Self-pay | Admitting: Rehabilitative and Restorative Service Providers"

## 2023-04-04 ENCOUNTER — Ambulatory Visit: Payer: Medicare HMO | Admitting: Rehabilitative and Restorative Service Providers"

## 2023-04-04 DIAGNOSIS — R296 Repeated falls: Secondary | ICD-10-CM | POA: Diagnosis not present

## 2023-04-04 DIAGNOSIS — M6281 Muscle weakness (generalized): Secondary | ICD-10-CM | POA: Diagnosis not present

## 2023-04-04 DIAGNOSIS — R293 Abnormal posture: Secondary | ICD-10-CM | POA: Diagnosis not present

## 2023-04-04 DIAGNOSIS — M5459 Other low back pain: Secondary | ICD-10-CM

## 2023-04-04 DIAGNOSIS — R42 Dizziness and giddiness: Secondary | ICD-10-CM | POA: Diagnosis not present

## 2023-04-04 DIAGNOSIS — R2689 Other abnormalities of gait and mobility: Secondary | ICD-10-CM

## 2023-04-04 NOTE — Therapy (Signed)
OUTPATIENT PHYSICAL THERAPY VESTIBULAR TREATMENT NOTE     Patient Name: Kent Watts MRN: [id number] DOB:04/29/1951, 72 y.o., male Today's Date: 04/04/2023  END OF SESSION:  PT End of Session - 04/04/23 1447     Visit Number 5    Date for PT Re-Evaluation 05/05/23    Authorization Type [employer]    Progress Note Due on Visit 10    PT Start Time 1446    PT Stop Time 1525    PT Time Calculation (min) 39 min    Activity Tolerance Patient tolerated treatment well    Behavior During Therapy WFL for tasks assessed/performed             Past Medical History:  Diagnosis Date   Hypertension    Past Surgical History:  Procedure Laterality Date   KNEE SURGERY Right    There are no problems to display for this patient.    REFERRING PROVIDER: Odis Luster, PA-C  REFERRING DIAG: R29.6 (ICD-10-CM) - Frequent falls  THERAPY DIAG:  Dizziness and giddiness  Abnormal posture  Other abnormalities of gait and mobility  Muscle weakness (generalized)  Other low back pain  ONSET DATE: 5 weeks ago  Rationale for Evaluation and Treatment: Rehabilitation  SUBJECTIVE:   SUBJECTIVE STATEMENT: Pt reports that his dizziness is similar, but it has not been getting worse.    Pt accompanied by: self  PERTINENT HISTORY: HTN, cataract surgery  PAIN:  Are you having pain? Yes: NPRS scale: 4-5/10 Pain location: low back Pain description: aching and throbbing Aggravating factors: certain positions and worse first thing in the AM Relieving factors: unknown  PRECAUTIONS: Fall  RED FLAGS: None   WEIGHT BEARING RESTRICTIONS: No  FALLS: Has patient fallen in last 6 months? Yes. 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  Is trying not to do too much moving of items, and Leisure: spending time with granddaughter  PATIENT GOALS: To improve balance and decrease dizziness.  OBJECTIVE:  Note: Objective measures were completed at Evaluation unless otherwise noted.  DIAGNOSTIC FINDINGS:  Lumbar Radiograph on 03/30/2022: IMPRESSION: 1. No acute fracture. 2. 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 3. Nonobstructive right renal calculus.  Cervical CT Scan on 03/29/2022: IMPRESSION: CT of the head: Small focus of hemorrhage along the falx just to the left of the midline CT of the cervical spine: Multilevel degenerative change without acute abnormality.  COGNITION: Overall cognitive status: Within functional limits for tasks assessed   SENSATION: Reports some numbness/tingling in bilat feet, especially at night  POSTURE:  rounded shoulders and forward head  Cervical ROM:    Eval:  limited 25% with pain and dizziness when looking up  STRENGTH:  Eval: Bilat UE strength is WFL Bilat quad strength is WFL Bilateral hip strength is 4/5 grossly throughout   FUNCTIONAL TESTS:  Eval:   5 times sit to stand: 16.43 sec MCTSIB: Condition 1: Avg of 3 trials: 30 sec, Condition 2: Avg of 3 trials: 30 sec, Condition 3: Avg of 3 trials: 30 sec, Condition 4: Avg of 3 trials: 30 sec, and Total Score: 120/120 Single Leg Stance:  Right- 7  sec, Left 11 sec  04/04/2023: 5 times sit to stand: 19.43 sec Single Leg Stance:  Right- 15.89 sec, Left 12.57 sec   VESTIBULAR ASSESSMENT:  SYMPTOM BEHAVIOR:  Non-Vestibular symptoms: neck pain  Type of dizziness: Spinning/Vertigo, Unsteady with head/body turns, and "World moves"  Frequency: can happen daily, some days worse than others  Duration: varies  Aggravating factors: Induced by position change: lying supine and rolling to the  right and Induced by motion: looking up at the ceiling and turning head quickly  Relieving factors: head stationary  Progression of symptoms: worse  OCULOMOTOR EXAM:  Ocular Alignment: normal  Ocular ROM: No Limitations  Spontaneous Nystagmus: absent  Gaze-Induced Nystagmus: absent  Smooth Pursuits: intact  Saccades: slow    VESTIBULAR - OCULAR REFLEX:   Head Thrust positive for nystagmus   POSITIONAL TESTING: Right Dix-Hallpike: nystagmus noted and Duration: 1 min    VESTIBULAR TREATMENT:                                                                                                    DATE:  04/04/2023: Nustep level 5 x5 min with PT present to discuss status Seated blue pball rollout 5x5 sec 5 times sit to stand, single leg stance Standing on purple foam pad with eyes open x30 sec, with eyes closed 2x30 sec Standing on purple foam pad performing vertical head nods and horizontal head rotations.  2x30 sec each Standing on purple foam pad performing tandem stance 2x20 sec with each foot leading Standing single leg stance with alt UE tapping to cone placed in chair in front of patient 2x10 bilat Dix Hallpike positive on the right side for 15 sec,  proceeded to canalith repositioning with Epley Maneuver x1.  Proceeded with canalith repositioning utilizing Deep Head Hanging Maneuver (pt with increased dizziness after)   03/31/2023: Nustep level 5 x5 min with PT present to discuss status Ambulation down PT gym hallway performing vertical head nods and horizontal head rotations.  X2 laps each Standing on purple foam pad with eyes open x30 sec, with eyes closed  Standing on purple foam pad performing vertical head nods and horizontal head rotations.  2x30 sec each Standing on flat side of bosu in parallel bars 2x1 min Dix Hallpike positive on the right side for 15 sec,  proceeded to canalith repositioning with Epley Maneuver x2   03/20/2023: Nustep level 5 x5 min with PT present  to discuss status Dix Hallpike positive on the right side, proceeded to canalith repositioning with Epley Maneuver x2 Dix Hallpike positive on left side, proceeded to canalith repositioning with Epley Maneuver x2 Pencil push-ups 2x10 Standing rocker board for DF/PF x2 min Standing with one foot on each lateral side of rocker board performing static balance x1 min Heel tap down from 2" step with UE support x10 bilat    PATIENT EDUCATION: Education details: Issued HEP Person educated: Patient Education method: Programmer, multimedia, Facilities manager, and Handouts Education comprehension: verbalized understanding  HOME EXERCISE PROGRAM: Access Code: [email or web address] URL: [email or web address] Date: 03/09/2023 Prepared by: Clydie Braun Lukah Goswami  Exercises - Pencil Pushups  - 1  x daily - 7 x weekly - 2 sets - 10 reps - Seated Gaze Stabilization with Head Rotation  - 1 x daily - 7 x weekly - 1 sets - 20 reps - Seated Gaze Stabilization with Head Nod  - 1 x daily - 7 x weekly - 1 sets - 20 reps - Brandt-Daroff Vestibular Exercise  - 1 x daily - 7 x weekly - 1 sets - 3 reps - Tandem Walking with Counter Support  - 1 x daily - 7 x weekly - 2 sets - 10 reps - Single Leg Stance with Support  - 1 x daily - 7 x weekly - 1 sets - 2 reps - 20 sec hold - Sit to Stand with Arms Crossed  - 1 x daily - 7 x weekly - 2 sets - 10 reps  GOALS: Goals reviewed with patient? Yes  SHORT TERM GOALS: Target date: 03/31/2023  Patient will be independent with initial HEP. Baseline: Goal status: MET on 03/21/2023  2.  Patient will report at least a 25% improvement in dizziness symptoms. Baseline:  Goal status: IN PROGRESS   LONG TERM GOALS: Target date: 05/05/2023  Patient will be independent with advanced HEP to allow for self progression after discharge. Baseline:  Goal status: IN PROGRESS  2.  Patient will report at least a 75% improvement in dizziness to allow him to perform functional tasks without  increased dizziness. Baseline:  Goal status: INITIAL  3.  Patient will increase bilat hip strength to at least 4+/5 to allow him to perform functional tasks with improve ease. Baseline: 4/5 Goal status: IN PROGRESS  4.  Patient will report pain of no increase in back pain with various typical household tasks and with community ambulation. Baseline:  Goal status: IN PROGRESS  5.  Patient will report no falls or loss in balance within 2 weeks leading up to discharge. Baseline:  Goal status: IN PROGRESS   ASSESSMENT:  CLINICAL IMPRESSION: Mr Camp presents to skilled PT with no new reports of loss of balance.  Patient does state that he continues to have dizziness with head movement.  Patient continues with positive [name] on right side and proceeded to [employer].  Attempted Deep Head Handing Maneuver secondary to patient with persistent symptoms.  Patient with increased dizziness following this canalith repositioning and required a seated recovery period following.  Patient did have increased single leg stance time noted today.  OBJECTIVE IMPAIRMENTS: Abnormal gait, decreased balance, decreased strength, increased muscle spasms, postural dysfunction, and pain.   ACTIVITY LIMITATIONS: carrying, lifting, bending, stairs, and bed mobility  PARTICIPATION LIMITATIONS: driving and community activity  PERSONAL FACTORS: Past/current experiences, Time since onset of injury/illness/exacerbation, and 3 comorbidities: HTN, cataract surgery, Hx of back pain are also affecting patient's functional outcome.   REHAB POTENTIAL: Good  CLINICAL DECISION MAKING: Evolving/moderate complexity  EVALUATION COMPLEXITY: Moderate   PLAN:  PT FREQUENCY: 2x/week  PT DURATION: 8 weeks  PLANNED INTERVENTIONS: Therapeutic exercises, Therapeutic activity, Neuromuscular re-education, Balance training, Gait training, Patient/Family education, Self Care, Joint mobilization, Joint manipulation, Stair  training, Vestibular training, Canalith repositioning, Aquatic Therapy, Dry Needling, Electrical stimulation, Spinal manipulation, Spinal mobilization, Cryotherapy, Moist heat, Taping, Traction, Ultrasound, Manual therapy, and Re-evaluation  PLAN FOR NEXT SESSION: Assess and progress HEP as indicated, vestibular rehab, strengthening, canalith repositioning as indicated   Reather Laurence, PT, DPT 04/04/23, 3:38 PM  [facility] [address], [address] [place], [place] [address] Phone # [phone] Fax [phone]

## 2023-04-06 ENCOUNTER — Encounter: Payer: Self-pay | Admitting: Rehabilitative and Restorative Service Providers"

## 2023-04-06 ENCOUNTER — Ambulatory Visit: Payer: Medicare HMO | Admitting: Rehabilitative and Restorative Service Providers"

## 2023-04-06 DIAGNOSIS — R2689 Other abnormalities of gait and mobility: Secondary | ICD-10-CM

## 2023-04-06 DIAGNOSIS — R293 Abnormal posture: Secondary | ICD-10-CM

## 2023-04-06 DIAGNOSIS — R42 Dizziness and giddiness: Secondary | ICD-10-CM | POA: Diagnosis not present

## 2023-04-06 DIAGNOSIS — M5459 Other low back pain: Secondary | ICD-10-CM | POA: Diagnosis not present

## 2023-04-06 DIAGNOSIS — M6281 Muscle weakness (generalized): Secondary | ICD-10-CM

## 2023-04-06 DIAGNOSIS — R296 Repeated falls: Secondary | ICD-10-CM | POA: Diagnosis not present

## 2023-04-06 NOTE — Therapy (Signed)
OUTPATIENT PHYSICAL THERAPY VESTIBULAR TREATMENT NOTE     Patient Name: Kent Watts MRN: [id number] DOB:[birth year], 72 y.o., male Today's Date: 04/06/2023  END OF SESSION:  PT End of Session - 04/06/23 1015     Visit Number 6    Date for PT Re-Evaluation 05/05/23    Authorization Type [employer]    Progress Note Due on Visit 10    PT Start Time 1013    PT Stop Time 1051    PT Time Calculation (min) 38 min    Activity Tolerance Patient tolerated treatment well    Behavior During Therapy WFL for tasks assessed/performed             Past Medical History:  Diagnosis Date   Hypertension    Past Surgical History:  Procedure Laterality Date   KNEE SURGERY Right    There are no problems to display for this patient.    REFERRING PROVIDER: Odis Luster, PA-C  REFERRING DIAG: R29.6 (ICD-10-CM) - Frequent falls  THERAPY DIAG:  Dizziness and giddiness  Abnormal posture  Other abnormalities of gait and mobility  Muscle weakness (generalized)  Other low back pain  ONSET DATE: 5 weeks ago  Rationale for Evaluation and Treatment: Rehabilitation  SUBJECTIVE:   SUBJECTIVE STATEMENT: Pt reports that his dizziness is better than when he left last visit.  States that he went home and had to lie down after last session.  Pt accompanied by: self  PERTINENT HISTORY: HTN, cataract surgery  PAIN:  Are you having pain? Yes: NPRS scale: 4-6/10 Pain location: low back Pain description: aching and throbbing Aggravating factors: certain positions and worse first thing in the AM Relieving factors: unknown  PRECAUTIONS: Fall  RED FLAGS: None   WEIGHT BEARING RESTRICTIONS: No  FALLS: Has patient fallen in last 6 months? Yes. 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  Is trying not to do too much moving of items, and Leisure: spending time with granddaughter  PATIENT GOALS: To improve balance and decrease dizziness.  OBJECTIVE:  Note: Objective measures were completed at Evaluation unless otherwise noted.  DIAGNOSTIC FINDINGS:  Lumbar Radiograph on 03/30/2022: IMPRESSION: 1. No acute fracture. 2. 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 3. Nonobstructive right renal calculus.  Cervical CT Scan on 03/29/2022: IMPRESSION: CT of the head: Small focus of hemorrhage along the falx just to the left of the midline CT of the cervical spine: Multilevel degenerative change without acute abnormality.  COGNITION: Overall cognitive status: Within functional limits for tasks assessed   SENSATION: Reports some numbness/tingling in bilat feet, especially at night  POSTURE:  rounded shoulders and forward head  Cervical ROM:    Eval:  limited 25% with pain and dizziness when looking up  STRENGTH:  Eval: Bilat UE strength is WFL Bilat quad strength is WFL Bilateral hip strength is 4/5 grossly throughout   FUNCTIONAL TESTS:  Eval:   5 times sit to stand: 16.43 sec MCTSIB: Condition 1: Avg of 3 trials: 30 sec, Condition 2: Avg of 3 trials: 30 sec, Condition 3: Avg of 3 trials: 30 sec, Condition 4: Avg of 3 trials: 30  sec, and Total Score: 120/120 Single Leg Stance:  Right- 7 sec, Left 11 sec  04/04/2023: 5 times sit to stand: 19.43 sec Single Leg Stance:  Right- 15.89 sec, Left 12.57 sec   VESTIBULAR ASSESSMENT:  SYMPTOM BEHAVIOR:  Non-Vestibular symptoms: neck pain  Type of dizziness: Spinning/Vertigo, Unsteady with head/body turns, and "World moves"  Frequency: can happen daily, some days worse than others  Duration: varies  Aggravating factors: Induced by  position change: lying supine and rolling to the right and Induced by motion: looking up at the ceiling and turning head quickly  Relieving factors: head stationary  Progression of symptoms: worse  OCULOMOTOR EXAM:  Ocular Alignment: normal  Ocular ROM: No Limitations  Spontaneous Nystagmus: absent  Gaze-Induced Nystagmus: absent  Smooth Pursuits: intact  Saccades: slow    VESTIBULAR - OCULAR REFLEX:   Head Thrust positive for nystagmus   POSITIONAL TESTING: Right Dix-Hallpike: nystagmus noted and Duration: 1 min    VESTIBULAR TREATMENT:                                                                                                    DATE:  04/06/2023: Nustep level 5 x5 min with PT present to discuss status Standing on purple foam pad at barre:  hip abduction, hip extension.  2x10 each bilat Manual:  Addaday to bilateral lumbar multifidi/paraspinals, glutes/piriformis, and upper traps to assist with decreased muscle spasms, increased blood flow and decreased pain.  Pt reported decreased pain following Seated 3 way blue pball rollout 5x5 sec each Seated scapular retraction x10 Seated cat/cow x10 4 square steps to colored dots on the floor x10   04/04/2023: Nustep level 5 x5 min with PT present to discuss status Seated blue pball rollout 5x5 sec 5 times sit to stand, single leg stance Standing on purple foam pad with eyes open x30 sec, with eyes closed 2x30 sec Standing on purple foam pad performing vertical head nods and horizontal head rotations.  2x30 sec each Standing on purple foam pad performing tandem stance 2x20 sec with each foot leading Standing single leg stance with alt UE tapping to cone placed in chair in front of patient 2x10 bilat Dix Hallpike positive on the right side for 15 sec,  proceeded to canalith repositioning with Epley Maneuver x1.  Proceeded with canalith repositioning utilizing Deep Head Hanging Maneuver (pt with increased dizziness  after)   03/31/2023: Nustep level 5 x5 min with PT present to discuss status Ambulation down PT gym hallway performing vertical head nods and horizontal head rotations.  X2 laps each Standing on purple foam pad with eyes open x30 sec, with eyes closed  Standing on purple foam pad performing vertical head nods and horizontal head rotations.  2x30 sec each Standing on flat side of bosu in parallel bars 2x1 min Dix Hallpike positive on the right side for 15 sec,  proceeded to canalith repositioning with Teaching laboratory technician x2     PATIENT EDUCATION: Education details: Issued HEP Person educated: Patient Education method: Explanation, Facilities manager, and Handouts Education comprehension: verbalized understanding  HOME EXERCISE PROGRAM: Access Code:  [email or web address] URL: [email or web address] Date: 03/09/2023 Prepared by: Clydie Braun Calypso Hagarty  Exercises - Pencil Pushups  - 1 x daily - 7 x weekly - 2 sets - 10 reps - Seated Gaze Stabilization with Head Rotation  - 1 x daily - 7 x weekly - 1 sets - 20 reps - Seated Gaze Stabilization with Head Nod  - 1 x daily - 7 x weekly - 1 sets - 20 reps - Brandt-Daroff Vestibular Exercise  - 1 x daily - 7 x weekly - 1 sets - 3 reps - Tandem Walking with Counter Support  - 1 x daily - 7 x weekly - 2 sets - 10 reps - Single Leg Stance with Support  - 1 x daily - 7 x weekly - 1 sets - 2 reps - 20 sec hold - Sit to Stand with Arms Crossed  - 1 x daily - 7 x weekly - 2 sets - 10 reps  GOALS: Goals reviewed with patient? Yes  SHORT TERM GOALS: Target date: 03/31/2023  Patient will be independent with initial HEP. Baseline: Goal status: MET on 03/21/2023  2.  Patient will report at least a 25% improvement in dizziness symptoms. Baseline:  Goal status: IN PROGRESS   LONG TERM GOALS: Target date: 05/05/2023  Patient will be independent with advanced HEP to allow for self progression after discharge. Baseline:  Goal status: IN PROGRESS  2.  Patient  will report at least a 75% improvement in dizziness to allow him to perform functional tasks without increased dizziness. Baseline:  Goal status: INITIAL  3.  Patient will increase bilat hip strength to at least 4+/5 to allow him to perform functional tasks with improve ease. Baseline: 4/5 Goal status: IN PROGRESS  4.  Patient will report pain of no increase in back pain with various typical household tasks and with community ambulation. Baseline:  Goal status: IN PROGRESS  5.  Patient will report no falls or loss in balance within 2 weeks leading up to discharge. Baseline:  Goal status: IN PROGRESS   ASSESSMENT:  CLINICAL IMPRESSION: Mr Reagle presents to skilled PT with increased complaints of back pain today, so focus of session was on some balance, but primarily to address lumbar pain and stiffness.  Following instrument assisted manual therapy, patient did report decreased pain.  Patient with only small/minor unsteadiness noted with 4 square stepping exercise at end of session.  Patient reported at end of session that his back did not seem to hurt quite as badly as it did upon arrival.  OBJECTIVE IMPAIRMENTS: Abnormal gait, decreased balance, decreased strength, increased muscle spasms, postural dysfunction, and pain.   ACTIVITY LIMITATIONS: carrying, lifting, bending, stairs, and bed mobility  PARTICIPATION LIMITATIONS: driving and community activity  PERSONAL FACTORS: Past/current experiences, Time since onset of injury/illness/exacerbation, and 3 comorbidities: HTN, cataract surgery, Hx of back pain are also affecting patient's functional outcome.   REHAB POTENTIAL: Good  CLINICAL DECISION MAKING: Evolving/moderate complexity  EVALUATION COMPLEXITY: Moderate   PLAN:  PT FREQUENCY: 2x/week  PT DURATION: 8 weeks  PLANNED INTERVENTIONS: Therapeutic exercises, Therapeutic activity, Neuromuscular re-education, Balance training, Gait training, Patient/Family education,  Self Care, Joint mobilization, Joint manipulation, Stair training, Vestibular training, Canalith repositioning, Aquatic Therapy, Dry Needling, Electrical stimulation, Spinal manipulation, Spinal mobilization, Cryotherapy, Moist heat, Taping, Traction, Ultrasound, Manual therapy, and Re-evaluation  PLAN FOR NEXT SESSION: Assess and progress HEP as indicated, vestibular rehab, strengthening, canalith repositioning as indicated   Reather Laurence, PT, DPT 04/06/23, 10:57 AM  [facility]  [address] [place], [place] [address] Phone # [phone] Fax [phone]

## 2023-04-11 ENCOUNTER — Ambulatory Visit: Payer: Medicare HMO | Attending: Family Medicine | Admitting: Rehabilitative and Restorative Service Providers"

## 2023-04-11 ENCOUNTER — Encounter: Payer: Self-pay | Admitting: Rehabilitative and Restorative Service Providers"

## 2023-04-11 DIAGNOSIS — R293 Abnormal posture: Secondary | ICD-10-CM | POA: Diagnosis not present

## 2023-04-11 DIAGNOSIS — R2689 Other abnormalities of gait and mobility: Secondary | ICD-10-CM | POA: Insufficient documentation

## 2023-04-11 DIAGNOSIS — M6281 Muscle weakness (generalized): Secondary | ICD-10-CM | POA: Diagnosis not present

## 2023-04-11 DIAGNOSIS — R42 Dizziness and giddiness: Secondary | ICD-10-CM | POA: Diagnosis not present

## 2023-04-11 DIAGNOSIS — M5459 Other low back pain: Secondary | ICD-10-CM | POA: Diagnosis not present

## 2023-04-11 NOTE — Therapy (Signed)
OUTPATIENT PHYSICAL THERAPY VESTIBULAR TREATMENT NOTE     Patient Name: Kent Watts MRN: [id number] DOB:Dec 24, 1950, 72 y.o., male Today's Date: 04/11/2023  END OF SESSION:  PT End of Session - 04/11/23 1059     Visit Number 7    Date for PT Re-Evaluation 05/05/23    Authorization Type [employer]    Progress Note Due on Visit 10    PT Start Time 1057    PT Stop Time 1140    PT Time Calculation (min) 43 min    Activity Tolerance Patient tolerated treatment well    Behavior During Therapy WFL for tasks assessed/performed             Past Medical History:  Diagnosis Date   Hypertension    Past Surgical History:  Procedure Laterality Date   KNEE SURGERY Right    There are no problems to display for this patient.    REFERRING PROVIDER: Odis Luster, PA-C  REFERRING DIAG: R29.6 (ICD-10-CM) - Frequent falls  THERAPY DIAG:  Dizziness and giddiness  Abnormal posture  Other abnormalities of gait and mobility  Muscle weakness (generalized)  Other low back pain  ONSET DATE: 5 weeks ago  Rationale for Evaluation and Treatment: Rehabilitation  SUBJECTIVE:   SUBJECTIVE STATEMENT: Pt reports that he is feeling similar to how he felt last visit.  Pt accompanied by: self  PERTINENT HISTORY: HTN, cataract surgery  PAIN:  Are you having pain? Yes: NPRS scale: 4-6/10 Pain location: low back Pain description: aching and throbbing Aggravating factors: certain positions and worse first thing in the AM Relieving factors: unknown  PRECAUTIONS: Fall  RED FLAGS: None   WEIGHT BEARING RESTRICTIONS: No  FALLS: Has patient fallen in last 6 months? Yes. 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  Is trying not to do too much moving of items, and Leisure: spending time with granddaughter  PATIENT GOALS: To improve balance and decrease dizziness.  OBJECTIVE:  Note: Objective measures were completed at Evaluation unless otherwise noted.  DIAGNOSTIC FINDINGS:  Lumbar Radiograph on 03/30/2022: IMPRESSION: 1. No acute fracture. 2. 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 3. Nonobstructive right renal calculus.  Cervical CT Scan on 03/29/2022: IMPRESSION: CT of the head: Small focus of hemorrhage along the falx just to the left of the midline CT of the cervical spine: Multilevel degenerative change without acute abnormality.  COGNITION: Overall cognitive status: Within functional limits for tasks assessed   SENSATION: Reports some numbness/tingling in bilat feet, especially at night  POSTURE:  rounded shoulders and forward head  Cervical ROM:    Eval:  limited 25% with pain and dizziness when looking up  STRENGTH:  Eval: Bilat UE strength is WFL Bilat quad strength is WFL Bilateral hip strength is 4/5 grossly throughout   FUNCTIONAL TESTS:  Eval:   5 times sit to stand: 16.43 sec MCTSIB: Condition 1: Avg of 3 trials: 30 sec, Condition 2: Avg of 3 trials: 30 sec, Condition 3: Avg of 3 trials: 30 sec, Condition 4: Avg of 3 trials: 30 sec, and Total Score: 120/120 Single Leg Stance:  Right- 7 sec, Left 11  sec  04/04/2023: 5 times sit to stand: 19.43 sec Single Leg Stance:  Right- 15.89 sec, Left 12.57 sec   VESTIBULAR ASSESSMENT:  SYMPTOM BEHAVIOR:  Non-Vestibular symptoms: neck pain  Type of dizziness: Spinning/Vertigo, Unsteady with head/body turns, and "World moves"  Frequency: can happen daily, some days worse than others  Duration: varies  Aggravating factors: Induced by position change: lying supine and rolling to the right and  Induced by motion: looking up at the ceiling and turning head quickly  Relieving factors: head stationary  Progression of symptoms: worse  OCULOMOTOR EXAM:  Ocular Alignment: normal  Ocular ROM: No Limitations  Spontaneous Nystagmus: absent  Gaze-Induced Nystagmus: absent  Smooth Pursuits: intact  Saccades: slow    VESTIBULAR - OCULAR REFLEX:   Head Thrust positive for nystagmus   POSITIONAL TESTING: Right Dix-Hallpike: nystagmus noted and Duration: 1 min    VESTIBULAR TREATMENT:                                                                                                    DATE:  04/11/2023 Nustep level 5 x6 min with PT present to discuss status (able to make a full loop around distance tool on nustep screen) Standing on foam pad at barre:  hip abduction, hip extension.  2x10 each bilat Standing on foam pad performing vertical head nods and horizontal head rotations.  X1 min each [employer] positive on the right side for 15 sec,  proceeded to canalith repositioning with Epley Maneuver x2 Manual:  Addaday to bilateral lumbar multifidi/paraspinals, glutes/piriformis, and upper traps to assist with decreased muscle spasms, increased blood flow and decreased pain.  Pt reported decreased pain following   04/06/2023: Nustep level 5 x5 min with PT present to discuss status Standing on purple foam pad at barre:  hip abduction, hip extension.  2x10 each bilat Manual:  Addaday to bilateral lumbar multifidi/paraspinals, glutes/piriformis, and upper traps to assist with decreased muscle spasms, increased blood flow and decreased pain.  Pt reported decreased pain following Seated 3 way blue pball rollout 5x5 sec each Seated scapular retraction x10 Seated cat/cow x10 4 square steps to colored dots on the floor x10   04/04/2023: Nustep level 5 x5 min with PT present to discuss status Seated blue pball rollout 5x5 sec 5 times sit to stand, single leg stance Standing on purple foam  pad with eyes open x30 sec, with eyes closed 2x30 sec Standing on purple foam pad performing vertical head nods and horizontal head rotations.  2x30 sec each Standing on purple foam pad performing tandem stance 2x20 sec with each foot leading Standing single leg stance with alt UE tapping to cone placed in chair in front of patient 2x10 bilat Dix Hallpike positive on the right side for 15 sec,  proceeded to canalith repositioning with Epley Maneuver x1.  Proceeded with canalith repositioning utilizing Deep Head Hanging Maneuver (pt with increased dizziness after)     PATIENT EDUCATION: Education details: Issued HEP Person educated: Patient Education method: Explanation, Demonstration, and Handouts Education comprehension: verbalized understanding  HOME EXERCISE PROGRAM: Access Code: [email or web address]  URL: [email or web address] Date: 03/09/2023 Prepared by: Clydie Braun Tamarcus Condie  Exercises - Pencil Pushups  - 1 x daily - 7 x weekly - 2 sets - 10 reps - Seated Gaze Stabilization with Head Rotation  - 1 x daily - 7 x weekly - 1 sets - 20 reps - Seated Gaze Stabilization with Head Nod  - 1 x daily - 7 x weekly - 1 sets - 20 reps - Brandt-Daroff Vestibular Exercise  - 1 x daily - 7 x weekly - 1 sets - 3 reps - Tandem Walking with Counter Support  - 1 x daily - 7 x weekly - 2 sets - 10 reps - Single Leg Stance with Support  - 1 x daily - 7 x weekly - 1 sets - 2 reps - 20 sec hold - Sit to Stand with Arms Crossed  - 1 x daily - 7 x weekly - 2 sets - 10 reps  GOALS: Goals reviewed with patient? Yes  SHORT TERM GOALS: Target date: 03/31/2023  Patient will be independent with initial HEP. Baseline: Goal status: MET on 03/21/2023  2.  Patient will report at least a 25% improvement in dizziness symptoms. Baseline:  Goal status: IN PROGRESS   LONG TERM GOALS: Target date: 05/05/2023  Patient will be independent with advanced HEP to allow for self progression after discharge. Baseline:   Goal status: IN PROGRESS  2.  Patient will report at least a 75% improvement in dizziness to allow him to perform functional tasks without increased dizziness. Baseline:  Goal status: INITIAL  3.  Patient will increase bilat hip strength to at least 4+/5 to allow him to perform functional tasks with improve ease. Baseline: 4/5 Goal status: IN PROGRESS  4.  Patient will report pain of no increase in back pain with various typical household tasks and with community ambulation. Baseline:  Goal status: IN PROGRESS  5.  Patient will report no falls or loss in balance within 2 weeks leading up to discharge. Baseline:  Goal status: IN PROGRESS   ASSESSMENT:  CLINICAL IMPRESSION: Mr Weatherbee presents to skilled PT with no new reports of falling.  Patient continues to have dizziness and states similar to previously.  Patient with positive left sided [name] noted today.  Continued with canalith repositioning with Epley Maneuver.  Patient continues with increased back pain and reported some relief of pain with use of instrument assisted manual therapy with Addaday, so perform that again today.  Pt reports decreased pain following.  Patient continues to require skilled PT to progress towards goal related activities.  OBJECTIVE IMPAIRMENTS: Abnormal gait, decreased balance, decreased strength, increased muscle spasms, postural dysfunction, and pain.   ACTIVITY LIMITATIONS: carrying, lifting, bending, stairs, and bed mobility  PARTICIPATION LIMITATIONS: driving and community activity  PERSONAL FACTORS: Past/current experiences, Time since onset of injury/illness/exacerbation, and 3 comorbidities: HTN, cataract surgery, Hx of back pain are also affecting patient's functional outcome.   REHAB POTENTIAL: Good  CLINICAL DECISION MAKING: Evolving/moderate complexity  EVALUATION COMPLEXITY: Moderate   PLAN:  PT FREQUENCY: 2x/week  PT DURATION: 8 weeks  PLANNED INTERVENTIONS: Therapeutic  exercises, Therapeutic activity, Neuromuscular re-education, Balance training, Gait training, Patient/Family education, Self Care, Joint mobilization, Joint manipulation, Stair training, Vestibular training, Canalith repositioning, Aquatic Therapy, Dry Needling, Electrical stimulation, Spinal manipulation, Spinal mobilization, Cryotherapy, Moist heat, Taping, Traction, Ultrasound, Manual therapy, and Re-evaluation  PLAN FOR NEXT SESSION: Assess and progress HEP as indicated, vestibular rehab, strengthening, canalith repositioning as indicated   Reather Laurence, PT, DPT 04/11/23,  12:27 PM  [facility] [address], [address] [place], [place] [address] Phone # [phone] Fax [phone]

## 2023-04-13 ENCOUNTER — Encounter: Payer: Self-pay | Admitting: Rehabilitative and Restorative Service Providers"

## 2023-04-13 ENCOUNTER — Ambulatory Visit: Payer: Medicare HMO | Admitting: Rehabilitative and Restorative Service Providers"

## 2023-04-13 DIAGNOSIS — R42 Dizziness and giddiness: Secondary | ICD-10-CM | POA: Diagnosis not present

## 2023-04-13 DIAGNOSIS — M5459 Other low back pain: Secondary | ICD-10-CM

## 2023-04-13 DIAGNOSIS — M6281 Muscle weakness (generalized): Secondary | ICD-10-CM

## 2023-04-13 DIAGNOSIS — R2689 Other abnormalities of gait and mobility: Secondary | ICD-10-CM

## 2023-04-13 DIAGNOSIS — R293 Abnormal posture: Secondary | ICD-10-CM | POA: Diagnosis not present

## 2023-04-13 NOTE — Therapy (Signed)
OUTPATIENT PHYSICAL THERAPY VESTIBULAR TREATMENT NOTE     Patient Name: Kent Watts MRN: [id number] DOB:[birth year], 72 y.o., male Today's Date: 04/13/2023  END OF SESSION:  PT End of Session - 04/13/23 1154     Visit Number 8    Date for PT Re-Evaluation 05/05/23    Authorization Type [employer]    Progress Note Due on Visit 10    PT Start Time 1151    PT Stop Time 1230    PT Time Calculation (min) 39 min    Activity Tolerance Patient tolerated treatment well    Behavior During Therapy WFL for tasks assessed/performed             Past Medical History:  Diagnosis Date   Hypertension    Past Surgical History:  Procedure Laterality Date   KNEE SURGERY Right    There are no problems to display for this patient.    REFERRING PROVIDER: Odis Luster, PA-C  REFERRING DIAG: R29.6 (ICD-10-CM) - Frequent falls  THERAPY DIAG:  Dizziness and giddiness  Abnormal posture  Other abnormalities of gait and mobility  Muscle weakness (generalized)  Other low back pain  ONSET DATE: 5 weeks ago  Rationale for Evaluation and Treatment: Rehabilitation  SUBJECTIVE:   SUBJECTIVE STATEMENT: Pt reports that since the initial evaluation, he has made approx 30% improvement in dizziness.  States similar to last visit.  Pt accompanied by: self  PERTINENT HISTORY: HTN, cataract surgery  PAIN:  Are you having pain? Yes: NPRS scale: 4/10 Pain location: right shoulder Pain description: aching and throbbing Aggravating factors: certain positions and worse first thing in the AM Relieving factors: unknown  PRECAUTIONS: Fall  RED FLAGS: None   WEIGHT BEARING RESTRICTIONS: No  FALLS: Has patient fallen in last 6 months? Yes. 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  Is trying not to do too much moving of items, and Leisure: spending time with granddaughter  PATIENT GOALS: To improve balance and decrease dizziness.  OBJECTIVE:  Note: Objective measures were completed at Evaluation unless otherwise noted.  DIAGNOSTIC FINDINGS:  Lumbar Radiograph on 03/30/2022: IMPRESSION: 1. No acute fracture. 2. 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 3. Nonobstructive right renal calculus.  Cervical CT Scan on 03/29/2022: IMPRESSION: CT of the head: Small focus of hemorrhage along the falx just to the left of the midline CT of the cervical spine: Multilevel degenerative change without acute abnormality.  COGNITION: Overall cognitive status: Within functional limits for tasks assessed   SENSATION: Reports some numbness/tingling in bilat feet, especially at night  POSTURE:  rounded shoulders and forward head  Cervical ROM:    Eval:  limited 25% with pain and dizziness when looking up  STRENGTH:  Eval: Bilat UE strength is WFL Bilat quad strength is WFL Bilateral hip strength is 4/5 grossly throughout   FUNCTIONAL TESTS:  Eval:   5 times sit to stand: 16.43 sec MCTSIB: Condition 1: Avg of 3 trials: 30 sec, Condition 2: Avg of 3 trials: 30 sec, Condition 3: Avg of 3 trials: 30 sec, Condition 4: Avg of 3 trials: 30 sec, and Total Score: 120/120 Single  Leg Stance:  Right- 7 sec, Left 11 sec  04/04/2023: 5 times sit to stand: 19.43 sec Single Leg Stance:  Right- 15.89 sec, Left 12.57 sec   VESTIBULAR ASSESSMENT:  SYMPTOM BEHAVIOR:  Non-Vestibular symptoms: neck pain  Type of dizziness: Spinning/Vertigo, Unsteady with head/body turns, and "World moves"  Frequency: can happen daily, some days worse than others  Duration: varies  Aggravating factors: Induced by  position change: lying supine and rolling to the right and Induced by motion: looking up at the ceiling and turning head quickly  Relieving factors: head stationary  Progression of symptoms: worse  OCULOMOTOR EXAM:  Ocular Alignment: normal  Ocular ROM: No Limitations  Spontaneous Nystagmus: absent  Gaze-Induced Nystagmus: absent  Smooth Pursuits: intact  Saccades: slow    VESTIBULAR - OCULAR REFLEX:   Head Thrust positive for nystagmus   POSITIONAL TESTING: Right Dix-Hallpike: nystagmus noted and Duration: 1 min    VESTIBULAR TREATMENT:                                                                                                    DATE:  04/13/2023 Nustep level 5 x6 min with PT present to discuss status Standing on foam pad at barre:  hip abduction, hip extension.  2x10 each bilat Marching on trampoline x2 min Standing in kickstand position reaching forward to touch cone x10 bilat Standing with 3 cones in front performing single cone tap x5 bilat Dix Hallpike positive on the right side for 15 sec,  proceeded to canalith repositioning with Epley Maneuver x1 Seated cat/cow x10 Seated scapular retraction x10 Seated cervical retraction x10   04/11/2023 Nustep level 5 x6 min with PT present to discuss status (able to make a full loop around distance tool on nustep screen) Standing on foam pad at barre:  hip abduction, hip extension.  2x10 each bilat Standing on foam pad performing vertical head nods and horizontal head rotations.  X1 min each [employer] positive on the right side for 15 sec,  proceeded to canalith repositioning with Epley Maneuver x2 Manual:  Addaday to bilateral lumbar multifidi/paraspinals, glutes/piriformis, and upper traps to assist with decreased muscle spasms, increased blood flow and decreased pain.  Pt reported decreased pain following   04/06/2023: Nustep level 5 x5 min with PT present to discuss status Standing on purple foam pad at barre:  hip  abduction, hip extension.  2x10 each bilat Manual:  Addaday to bilateral lumbar multifidi/paraspinals, glutes/piriformis, and upper traps to assist with decreased muscle spasms, increased blood flow and decreased pain.  Pt reported decreased pain following Seated 3 way blue pball rollout 5x5 sec each Seated scapular retraction x10 Seated cat/cow x10 4 square steps to colored dots on the floor x10    PATIENT EDUCATION: Education details: Issued HEP Person educated: Patient Education method: Explanation, Facilities manager, and Handouts Education comprehension: verbalized understanding  HOME EXERCISE PROGRAM: Access Code: [email or web address] URL: [email or web address] Date: 03/09/2023 Prepared by: Clydie Braun Mickey Esguerra  Exercises - Pencil Pushups  - 1 x daily - 7 x weekly - 2 sets - 10 reps - Seated Gaze Stabilization with  Head Rotation  - 1 x daily - 7 x weekly - 1 sets - 20 reps - Seated Gaze Stabilization with Head Nod  - 1 x daily - 7 x weekly - 1 sets - 20 reps - Brandt-Daroff Vestibular Exercise  - 1 x daily - 7 x weekly - 1 sets - 3 reps - Tandem Walking with Counter Support  - 1 x daily - 7 x weekly - 2 sets - 10 reps - Single Leg Stance with Support  - 1 x daily - 7 x weekly - 1 sets - 2 reps - 20 sec hold - Sit to Stand with Arms Crossed  - 1 x daily - 7 x weekly - 2 sets - 10 reps  GOALS: Goals reviewed with patient? Yes  SHORT TERM GOALS: Target date: 03/31/2023  Patient will be independent with initial HEP. Baseline: Goal status: MET on 03/21/2023  2.  Patient will report at least a 25% improvement in dizziness symptoms. Baseline:  Goal status: MET   LONG TERM GOALS: Target date: 05/05/2023  Patient will be independent with advanced HEP to allow for self progression after discharge. Baseline:  Goal status: IN PROGRESS  2.  Patient will report at least a 75% improvement in dizziness to allow him to perform functional tasks without increased dizziness. Baseline:  Reports 30% improvement on 04/13/23 Goal status: IN PROGRESS  3.  Patient will increase bilat hip strength to at least 4+/5 to allow him to perform functional tasks with improve ease. Baseline: 4/5 Goal status: IN PROGRESS  4.  Patient will report pain of no increase in back pain with various typical household tasks and with community ambulation. Baseline:  Goal status: IN PROGRESS  5.  Patient will report no falls or loss in balance within 2 weeks leading up to discharge. Baseline:  Goal status: IN PROGRESS   ASSESSMENT:  CLINICAL IMPRESSION: Mr Siefring presents to PT reporting approximately 30% improvement since initial evaluation.  Patient continues to have decreased balance and unsteadiness with ambulation at times.  Patient continues with positive [name] and proceeded with Epley Maneuver for canalith repositioning.  Patient continues to require skilled PT to progress towards goal related activities.  OBJECTIVE IMPAIRMENTS: Abnormal gait, decreased balance, decreased strength, increased muscle spasms, postural dysfunction, and pain.   ACTIVITY LIMITATIONS: carrying, lifting, bending, stairs, and bed mobility  PARTICIPATION LIMITATIONS: driving and community activity  PERSONAL FACTORS: Past/current experiences, Time since onset of injury/illness/exacerbation, and 3 comorbidities: HTN, cataract surgery, Hx of back pain are also affecting patient's functional outcome.   REHAB POTENTIAL: Good  CLINICAL DECISION MAKING: Evolving/moderate complexity  EVALUATION COMPLEXITY: Moderate   PLAN:  PT FREQUENCY: 2x/week  PT DURATION: 8 weeks  PLANNED INTERVENTIONS: Therapeutic exercises, Therapeutic activity, Neuromuscular re-education, Balance training, Gait training, Patient/Family education, Self Care, Joint mobilization, Joint manipulation, Stair training, Vestibular training, Canalith repositioning, Aquatic Therapy, Dry Needling, Electrical stimulation, Spinal manipulation,  Spinal mobilization, Cryotherapy, Moist heat, Taping, Traction, Ultrasound, Manual therapy, and Re-evaluation  PLAN FOR NEXT SESSION: Assess and progress HEP as indicated, vestibular rehab, strengthening, canalith repositioning as indicated   Reather Laurence, PT, DPT 04/13/23, 12:55 PM  [facility] [address] [place], [place] [address] Phone # [phone] Fax [phone]

## 2023-04-18 ENCOUNTER — Encounter: Payer: Self-pay | Admitting: Rehabilitative and Restorative Service Providers"

## 2023-04-18 ENCOUNTER — Ambulatory Visit: Payer: Medicare HMO | Admitting: Rehabilitative and Restorative Service Providers"

## 2023-04-18 DIAGNOSIS — R293 Abnormal posture: Secondary | ICD-10-CM | POA: Diagnosis not present

## 2023-04-18 DIAGNOSIS — M6281 Muscle weakness (generalized): Secondary | ICD-10-CM | POA: Diagnosis not present

## 2023-04-18 DIAGNOSIS — R2689 Other abnormalities of gait and mobility: Secondary | ICD-10-CM | POA: Diagnosis not present

## 2023-04-18 DIAGNOSIS — R42 Dizziness and giddiness: Secondary | ICD-10-CM | POA: Diagnosis not present

## 2023-04-18 DIAGNOSIS — M5459 Other low back pain: Secondary | ICD-10-CM

## 2023-04-18 NOTE — Therapy (Signed)
OUTPATIENT PHYSICAL THERAPY VESTIBULAR TREATMENT NOTE     Patient Name: Kent Watts MRN: [id number] DOB:[birth year], 72 y.o., male Today's Date: 04/18/2023  END OF SESSION:  PT End of Session - 04/18/23 1448     Visit Number 9    Date for PT Re-Evaluation 05/05/23    Authorization Type [employer]    Progress Note Due on Visit 10    PT Start Time 1446    PT Stop Time 1525    PT Time Calculation (min) 39 min    Activity Tolerance Patient tolerated treatment well    Behavior During Therapy WFL for tasks assessed/performed             Past Medical History:  Diagnosis Date   Hypertension    Past Surgical History:  Procedure Laterality Date   KNEE SURGERY Right    There are no problems to display for this patient.    REFERRING PROVIDER: Odis Luster, PA-C  REFERRING DIAG: R29.6 (ICD-10-CM) - Frequent falls  THERAPY DIAG:  Dizziness and giddiness  Abnormal posture  Other abnormalities of gait and mobility  Muscle weakness (generalized)  Other low back pain  ONSET DATE: 5 weeks ago  Rationale for Evaluation and Treatment: Rehabilitation  SUBJECTIVE:   SUBJECTIVE STATEMENT: Pt reports that he is feeling similar to how he was feeling last visit.  Pt accompanied by: self  PERTINENT HISTORY: HTN, cataract surgery  PAIN:  Are you having pain? Yes: NPRS scale: 5/10 Pain location: right shoulder Pain description: aching and throbbing Aggravating factors: certain positions and worse first thing in the AM Relieving factors: unknown  PRECAUTIONS: Fall  RED FLAGS: None   WEIGHT BEARING RESTRICTIONS: No  FALLS: Has patient fallen in last 6 months? Yes. 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  Is trying not to do too much moving of items, and Leisure: spending time with granddaughter  PATIENT GOALS: To improve balance and decrease dizziness.  OBJECTIVE:  Note: Objective measures were completed at Evaluation unless otherwise noted.  DIAGNOSTIC FINDINGS:  Lumbar Radiograph on 03/30/2022: IMPRESSION: 1. No acute fracture. 2. 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 3. Nonobstructive right renal calculus.  Cervical CT Scan on 03/29/2022: IMPRESSION: CT of the head: Small focus of hemorrhage along the falx just to the left of the midline CT of the cervical spine: Multilevel degenerative change without acute abnormality.  COGNITION: Overall cognitive status: Within functional limits for tasks assessed   SENSATION: Reports some numbness/tingling in bilat feet, especially at night  POSTURE:  rounded shoulders and forward head  Cervical ROM:    Eval:  limited 25% with pain and dizziness when looking up  STRENGTH:  Eval: Bilat UE strength is WFL Bilat quad strength is WFL Bilateral hip strength is 4/5 grossly throughout   FUNCTIONAL TESTS:  Eval:   5 times sit to stand: 16.43 sec MCTSIB: Condition 1: Avg of 3 trials: 30 sec, Condition 2: Avg of 3 trials: 30 sec, Condition 3: Avg of 3 trials: 30 sec, Condition 4: Avg of 3 trials: 30 sec, and Total Score: 120/120 Single Leg Stance:  Right- 7 sec, Left  11 sec  04/04/2023: 5 times sit to stand: 19.43 sec Single Leg Stance:  Right- 15.89 sec, Left 12.57 sec   VESTIBULAR ASSESSMENT:  SYMPTOM BEHAVIOR:  Non-Vestibular symptoms: neck pain  Type of dizziness: Spinning/Vertigo, Unsteady with head/body turns, and "World moves"  Frequency: can happen daily, some days worse than others  Duration: varies  Aggravating factors: Induced by position change: lying supine and rolling to the  right and Induced by motion: looking up at the ceiling and turning head quickly  Relieving factors: head stationary  Progression of symptoms: worse  OCULOMOTOR EXAM:  Ocular Alignment: normal  Ocular ROM: No Limitations  Spontaneous Nystagmus: absent  Gaze-Induced Nystagmus: absent  Smooth Pursuits: intact  Saccades: slow    VESTIBULAR - OCULAR REFLEX:   Head Thrust positive for nystagmus   POSITIONAL TESTING: Right Dix-Hallpike: nystagmus noted and Duration: 1 min    VESTIBULAR TREATMENT:                                                                                                    DATE:  04/18/2023 Nustep level 5 x6 min with PT present to discuss status Tandem gait on AirEx beam down and back x4  Lateral stepping on AirEx beam down and back x4 bilat FWD step ups onto bosu x10 bilat Side step up and over bosu x10 bilat Standing on flat side of bosu 2x1 min (in parallel balance) Dix Hallpike positive on the right side for 10-15 sec,  proceeded to canalith repositioning with Epley Maneuver x1   04/13/2023 Nustep level 5 x6 min with PT present to discuss status Standing on foam pad at barre:  hip abduction, hip extension.  2x10 each bilat Marching on trampoline x2 min Standing in kickstand position reaching forward to touch cone x10 bilat Standing with 3 cones in front performing single cone tap x5 bilat Dix Hallpike positive on the right side for 15 sec,  proceeded to canalith repositioning with Epley Maneuver x1 Seated cat/cow x10 Seated scapular retraction x10 Seated cervical retraction x10   04/11/2023 Nustep level 5 x6 min with PT present to discuss status (able to make a full loop around distance tool on nustep screen) Standing on foam pad at barre:  hip abduction, hip extension.  2x10 each bilat Standing on foam pad performing vertical head nods and horizontal head rotations.  X1 min each [employer] positive on the right side for 15 sec,  proceeded to  canalith repositioning with Epley Maneuver x2 Manual:  Addaday to bilateral lumbar multifidi/paraspinals, glutes/piriformis, and upper traps to assist with decreased muscle spasms, increased blood flow and decreased pain.  Pt reported decreased pain following    PATIENT EDUCATION: Education details: Issued HEP Person educated: Patient Education method: Explanation, Demonstration, and Handouts Education comprehension: verbalized understanding  HOME EXERCISE PROGRAM: Access Code: [email or web address] URL: [email or web address] Date: 03/09/2023 Prepared by: Clydie Braun Jewelianna Pancoast  Exercises - Pencil Pushups  - 1 x daily - 7 x weekly - 2 sets - 10 reps - Seated Gaze Stabilization with Head Rotation  - 1 x daily - 7 x weekly - 1  sets - 20 reps - Seated Gaze Stabilization with Head Nod  - 1 x daily - 7 x weekly - 1 sets - 20 reps - Brandt-Daroff Vestibular Exercise  - 1 x daily - 7 x weekly - 1 sets - 3 reps - Tandem Walking with Counter Support  - 1 x daily - 7 x weekly - 2 sets - 10 reps - Single Leg Stance with Support  - 1 x daily - 7 x weekly - 1 sets - 2 reps - 20 sec hold - Sit to Stand with Arms Crossed  - 1 x daily - 7 x weekly - 2 sets - 10 reps  GOALS: Goals reviewed with patient? Yes  SHORT TERM GOALS: Target date: 03/31/2023  Patient will be independent with initial HEP. Baseline: Goal status: MET on 03/21/2023  2.  Patient will report at least a 25% improvement in dizziness symptoms. Baseline:  Goal status: MET   LONG TERM GOALS: Target date: 05/05/2023  Patient will be independent with advanced HEP to allow for self progression after discharge. Baseline:  Goal status: IN PROGRESS  2.  Patient will report at least a 75% improvement in dizziness to allow him to perform functional tasks without increased dizziness. Baseline: Reports 30% improvement on 04/13/23 Goal status: IN PROGRESS  3.  Patient will increase bilat hip strength to at least 4+/5 to allow him to  perform functional tasks with improve ease. Baseline: 4/5 Goal status: IN PROGRESS  4.  Patient will report pain of no increase in back pain with various typical household tasks and with community ambulation. Baseline:  Goal status: IN PROGRESS  5.  Patient will report no falls or loss in balance within 2 weeks leading up to discharge. Baseline:  Goal status: IN PROGRESS   ASSESSMENT:  CLINICAL IMPRESSION: Mr Castel presents to PT with continued reports of dizziness.  Inquired about next follow up with PCP and patient states that he does not have an appointment scheduled.  Recommended that patient schedule a PCP follow up to ensure that there are no other underlying causes to the continued dizziness.  Patient continues to progress with balance exercises during session.  Patient continues to have positive [name] on right side and proceeded with Epley maneuver for treatment.  OBJECTIVE IMPAIRMENTS: Abnormal gait, decreased balance, decreased strength, increased muscle spasms, postural dysfunction, and pain.   ACTIVITY LIMITATIONS: carrying, lifting, bending, stairs, and bed mobility  PARTICIPATION LIMITATIONS: driving and community activity  PERSONAL FACTORS: Past/current experiences, Time since onset of injury/illness/exacerbation, and 3 comorbidities: HTN, cataract surgery, Hx of back pain are also affecting patient's functional outcome.   REHAB POTENTIAL: Good  CLINICAL DECISION MAKING: Evolving/moderate complexity  EVALUATION COMPLEXITY: Moderate   PLAN:  PT FREQUENCY: 2x/week  PT DURATION: 8 weeks  PLANNED INTERVENTIONS: Therapeutic exercises, Therapeutic activity, Neuromuscular re-education, Balance training, Gait training, Patient/Family education, Self Care, Joint mobilization, Joint manipulation, Stair training, Vestibular training, Canalith repositioning, Aquatic Therapy, Dry Needling, Electrical stimulation, Spinal manipulation, Spinal mobilization, Cryotherapy,  Moist heat, Taping, Traction, Ultrasound, Manual therapy, and Re-evaluation  PLAN FOR NEXT SESSION: Assess and progress HEP as indicated, vestibular rehab, strengthening, canalith repositioning as indicated   Reather Laurence, PT, DPT 04/18/23, 3:38 PM  [facility] [address] [place], [place] [address] Phone # [phone] Fax [phone]

## 2023-04-20 ENCOUNTER — Ambulatory Visit: Payer: Medicare HMO | Admitting: Rehabilitative and Restorative Service Providers"

## 2023-04-20 ENCOUNTER — Encounter: Payer: Self-pay | Admitting: Rehabilitative and Restorative Service Providers"

## 2023-04-20 DIAGNOSIS — R42 Dizziness and giddiness: Secondary | ICD-10-CM

## 2023-04-20 DIAGNOSIS — R293 Abnormal posture: Secondary | ICD-10-CM

## 2023-04-20 DIAGNOSIS — R2689 Other abnormalities of gait and mobility: Secondary | ICD-10-CM

## 2023-04-20 DIAGNOSIS — M5459 Other low back pain: Secondary | ICD-10-CM

## 2023-04-20 DIAGNOSIS — M6281 Muscle weakness (generalized): Secondary | ICD-10-CM | POA: Diagnosis not present

## 2023-04-20 NOTE — Therapy (Signed)
OUTPATIENT PHYSICAL THERAPY VESTIBULAR NOTE/RE-CERT     Patient Name: Kent Watts MRN: [id number] DOB:06/15/1950, 72 y.o., male Today's Date: 04/24/2023      END OF SESSION:  PT End of Session - 04/24/23 1413     Visit Number 11    Number of Visits 19    Date for PT Re-Evaluation 05/22/23    Authorization Type [employer] Medicare    Progress Note Due on Visit 20    PT Start Time 1319    PT Stop Time 1410    PT Time Calculation (min) 51 min    Activity Tolerance Patient tolerated treatment well    Behavior During Therapy WFL for tasks assessed/performed              Past Medical History:  Diagnosis Date   Hypertension    Past Surgical History:  Procedure Laterality Date   KNEE SURGERY Right    There are no problems to display for this patient.    REFERRING PROVIDER: Odis Luster, PA-C  REFERRING DIAG: R29.6 (ICD-10-CM) - Frequent falls  THERAPY DIAG:  Dizziness and giddiness  Abnormal posture  Other abnormalities of gait and mobility  Muscle weakness (generalized)  Other low back pain  ONSET DATE: 5 weeks ago  Rationale for Evaluation and Treatment: Rehabilitation  SUBJECTIVE:   SUBJECTIVE STATEMENT: Patient reports that he vomited after last appointment and currently feeling about the same as he was (not better/not worse). 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 Reports episodes come and go and has had episodes since initial onset. Current episode ongoing for the past 3 weeks. Reports a hx of head trauma after a fall almost a year ago. Reports imbalance. Denies recent head trauma, infection/illness, vision changes/double vision, hearing loss, tinnitus. Reports migraines in elementary school which was resolved when he got glasses. Dizziness is worse with rolling, getting up from bed, leaning forward, sitting up from a chair.    Pt accompanied by: self  PERTINENT HISTORY:  HTN, cataract surgery  PAIN:  Are you having pain? Yes: NPRS scale: 0/10 Pain location: right shoulder Pain description: aching and throbbing Aggravating factors: certain positions and worse first thing in the AM Relieving factors: unknown  PRECAUTIONS: Fall  RED FLAGS: None   WEIGHT BEARING RESTRICTIONS: No  FALLS: Has patient fallen in last 6 months? Yes. 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  Is trying not to do too much moving of items, and Leisure: spending time with granddaughter  PATIENT GOALS: To improve balance and decrease dizziness.  OBJECTIVE:       TODAY'S TREATMENT: 04/24/23  VESTIBULAR ASSESSMENT   GENERAL OBSERVATION: pt wears readers (hx of cataract surgery)   OCULOMOTOR EXAM:   Ocular Alignment: normal   Ocular ROM: No Limitations   Spontaneous Nystagmus: absent   Gaze-Induced Nystagmus: absent   Smooth Pursuits: intact   Saccades: intact   Convergence/Divergence: c/o diplopia at 1 foot away   VESTIBULAR - OCULAR REFLEX:    Slow VOR: Normal c/o " a little swirly"; slow    VOR  Cancellation: Normal   Head-Impulse Test: HIT Right: covert positive HIT Left: negative    POSITIONAL TESTING:  Right Roll Test: negative  Left Roll Test: negative  Right Sidelying: R upbeating torsional lasting ~25 sec; dizziness upon sitting up with reversal of nystagmus  Left Sidelying: L upbeating torsional lasting ~15 sec; dizziness upon sitting up  Right Dix-Hallpike: upbeating nystagmus lasting ~10 sec followed by downbeat nystagmus lasting ~15 sec     Activity Comments  R epley  L  upbeating torsional nystagmus lasting ~20 sec, then possible small amplitude downbeating; retropulsion and dizziness upon sitting up    HOME EXERCISE PROGRAM Last updated: 04/24/23 Access Code: [email or web address] URL: [email or web address] Date: 04/24/2023 Prepared by: [facility] - Outpatient  Rehab - [facility]  Exercises - Brandt-Daroff Vestibular Exercise  - 1 x daily - 5 x weekly - 2 sets - 3-5 reps   PATIENT EDUCATION: Education details: edu on BPPV risk factors such as vitamin D deficiency and poor hydration; handout and edu on BPPV, HEP to be performed with wife for safety Person educated: Patient Education method: Explanation, Demonstration, Tactile cues, Verbal cues, and Handouts Education comprehension: verbalized understanding and returned demonstration   Note: Objective measures were completed at Evaluation unless otherwise noted.  DIAGNOSTIC FINDINGS:  Lumbar Radiograph on 03/30/2022: IMPRESSION: 1. No acute fracture. 2. 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 3. Nonobstructive right renal calculus.  Cervical CT Scan on 03/29/2022: IMPRESSION: CT of the head: Small focus of hemorrhage along the falx just to the left of the midline CT of the cervical spine: Multilevel degenerative change without acute abnormality.  COGNITION: Overall cognitive status: Within functional limits for tasks assessed   SENSATION: Reports some numbness/tingling in bilat feet, especially at night  POSTURE:  rounded shoulders and forward head  Cervical ROM:    Eval:  limited 25% with pain and dizziness when looking up  STRENGTH:  Eval: Bilat UE strength is WFL Bilat quad strength is WFL Bilateral hip strength is 4/5 grossly throughout   FUNCTIONAL TESTS:  Eval:   5 times sit to stand: 16.43 sec MCTSIB: Condition 1: Avg of 3 trials: 30 sec, Condition 2: Avg of 3 trials: 30 sec, Condition 3: Avg of 3 trials: 30 sec, Condition 4: Avg of 3 trials: 30 sec, and  Total Score: 120/120 Single Leg Stance:  Right- 7 sec, Left 11 sec  04/04/2023: 5 times sit to stand: 19.43 sec Single Leg Stance:  Right- 15.89 sec, Left 12.57 sec   VESTIBULAR ASSESSMENT:  SYMPTOM BEHAVIOR:  Non-Vestibular symptoms: neck pain  Type of dizziness: Spinning/Vertigo, Unsteady with head/body turns, and "World moves"  Frequency: can happen daily, some days worse than others  Duration: varies  Aggravating factors: Induced by position change: lying supine and rolling to the right and Induced by motion: looking up at the ceiling and turning head quickly  Relieving factors: head stationary  Progression of symptoms: worse  OCULOMOTOR EXAM:  Ocular Alignment: normal  Ocular ROM: No Limitations  Spontaneous Nystagmus: absent  Gaze-Induced Nystagmus: absent  Smooth Pursuits: intact  Saccades: slow    VESTIBULAR - OCULAR REFLEX:   Head Thrust positive for nystagmus   POSITIONAL TESTING: Right Dix-Hallpike: nystagmus noted and Duration: 1 min   PATIENT EDUCATION: Education details: Issued HEP Person educated: Patient Education method: Explanation, Demonstration, and Handouts Education comprehension: verbalized understanding  HOME EXERCISE PROGRAM: Access Code: [email or web address] URL: [email or web address] Date: 03/09/2023 Prepared by: Clydie Braun Menke  Exercises - Pencil Pushups  - 1 x daily -  7 x weekly - 2 sets - 10 reps - Seated Gaze Stabilization with Head Rotation  - 1 x daily - 7 x weekly - 1 sets - 20 reps - Seated Gaze Stabilization with Head Nod  - 1 x daily - 7 x weekly - 1 sets - 20 reps - Brandt-Daroff Vestibular Exercise  - 1 x daily - 7 x weekly - 1 sets - 3 reps - Tandem Walking with Counter Support  - 1 x daily - 7 x weekly - 2 sets - 10 reps - Single Leg Stance with Support  - 1 x daily - 7 x weekly - 1 sets - 2 reps - 20 sec hold - Sit to Stand with Arms Crossed  - 1 x daily - 7 x weekly - 2 sets - 10 reps  GOALS: Goals reviewed with  patient? Yes  SHORT TERM GOALS: Target date: 03/31/2023  Patient will be independent with initial HEP. Baseline: Goal status: MET on 03/21/2023  2.  Patient will report at least a 25% improvement in dizziness symptoms. Baseline:  Goal status: MET   LONG TERM GOALS: Target date: 112/16/2024  Patient will be independent with advanced HEP to allow for self progression after discharge. Baseline:  Goal status: IN PROGRESS 04/24/23  2.  Patient will report at least a 75% improvement in dizziness to allow him to perform functional tasks without increased dizziness. Baseline: Reports 30% improvement on 04/13/23 Goal status: IN PROGRESS  3.  Patient will increase bilat hip strength to at least 4+/5 to allow him to perform functional tasks with improve ease. Baseline: 4/5 Goal status: IN PROGRESS 04/24/23  4.  Patient will report pain of no increase in back pain with various typical household tasks and with community ambulation. Baseline:  Goal status: IN PROGRESS 04/24/23  5.  Patient will report no falls or loss in balance within 2 weeks leading up to discharge. Baseline:  Goal status: IN PROGRESS 04/24/23   ASSESSMENT:  CLINICAL IMPRESSION: Patient arrived to session with report of continued dizziness, worst with position changes. 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 Patient treated with R Epley x1. Educated on habituation HEP to be performed with wife for safety and on BPPV. Would benefit from additional skilled PT services 1-2x/week for 4 weeks to address dizziness.   OBJECTIVE IMPAIRMENTS: Abnormal gait, decreased balance, decreased strength, increased muscle spasms, postural dysfunction, and pain.   ACTIVITY LIMITATIONS: carrying, lifting, bending, stairs, and bed mobility  PARTICIPATION LIMITATIONS: driving and community activity  PERSONAL FACTORS: Past/current experiences, Time since onset of  injury/illness/exacerbation, and 3 comorbidities: HTN, cataract surgery, Hx of back pain are also affecting patient's functional outcome.   REHAB POTENTIAL: Good  CLINICAL DECISION MAKING: Evolving/moderate complexity  EVALUATION COMPLEXITY: Moderate   PLAN:  PT FREQUENCY: 2x/week  PT DURATION: 8 weeks  PLANNED INTERVENTIONS: Therapeutic exercises, Therapeutic activity, Neuromuscular re-education, Balance training, Gait training, Patient/Family education, Self Care, Joint mobilization, Joint manipulation, Stair training, Vestibular training, Canalith repositioning, Aquatic Therapy, Dry Needling, Electrical stimulation, Spinal manipulation, Spinal mobilization, Cryotherapy, Moist heat, Taping, Traction, Ultrasound, Manual therapy, and Re-evaluation  PLAN FOR NEXT SESSION: reassess positional testing- if unsuccessful, try vibration, habituation, or other maneuvers?    Anette Guarneri, PT, DPT 04/24/23 2:23 PM  [facility] [address] [place], [address] [place], [place] [address] Phone # [phone] Fax # [phone]

## 2023-04-20 NOTE — Therapy (Signed)
OUTPATIENT PHYSICAL THERAPY VESTIBULAR TREATMENT NOTE     Patient Name: Kent Watts MRN: [id number] DOB:08/07/1950, 72 y.o., male Today's Date: 04/20/2023   Progress Note Reporting Period 03/09/2023 to 04/20/2023  See note below for Objective Data and Assessment of Progress/Goals.       END OF SESSION:  PT End of Session - 04/20/23 1105     Visit Number 10    Date for PT Re-Evaluation 05/05/23    Authorization Type [employer]    Progress Note Due on Visit 20    PT Start Time 1103    PT Stop Time 1141    PT Time Calculation (min) 38 min    Activity Tolerance Patient tolerated treatment well    Behavior During Therapy WFL for tasks assessed/performed             Past Medical History:  Diagnosis Date   Hypertension    Past Surgical History:  Procedure Laterality Date   KNEE SURGERY Right    There are no problems to display for this patient.    REFERRING PROVIDER: Odis Luster, PA-C  REFERRING DIAG: R29.6 (ICD-10-CM) - Frequent falls  THERAPY DIAG:  Dizziness and giddiness  Abnormal posture  Other abnormalities of gait and mobility  Muscle weakness (generalized)  Other low back pain  ONSET DATE: 5 weeks ago  Rationale for Evaluation and Treatment: Rehabilitation  SUBJECTIVE:   SUBJECTIVE STATEMENT: Pt reports that he started having some increased dizziness the night of his last treatment session and reports that his dizziness has been bad since then.  States that he had to take some medication one night.  Pt accompanied by: self  PERTINENT HISTORY: HTN, cataract surgery  PAIN:  Are you having pain? Yes: NPRS scale: 5/10 Pain location: right shoulder Pain description: aching and throbbing Aggravating factors: certain positions and worse first thing in the AM Relieving factors: unknown  PRECAUTIONS: Fall  RED FLAGS: None   WEIGHT BEARING RESTRICTIONS: No  FALLS: Has patient fallen in last 6 months? Yes. 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  Is trying not to do too much moving of items, and Leisure: spending time with granddaughter  PATIENT GOALS: To improve balance and decrease dizziness.  OBJECTIVE:  Note: Objective measures were completed at Evaluation unless otherwise noted.  DIAGNOSTIC FINDINGS:  Lumbar Radiograph on 03/30/2022: IMPRESSION: 1. No acute fracture. 2. 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 3. Nonobstructive right renal calculus.  Cervical CT Scan on 03/29/2022: IMPRESSION: CT of the head: Small focus of hemorrhage along the falx just to the left of the midline CT of the cervical spine: Multilevel degenerative change without acute abnormality.  COGNITION: Overall cognitive status: Within functional limits for tasks assessed   SENSATION: Reports some numbness/tingling in bilat feet, especially at night  POSTURE:  rounded shoulders and forward head  Cervical ROM:    Eval:  limited 25% with pain and dizziness when looking up  STRENGTH:  Eval: Bilat UE strength is WFL Bilat quad strength is WFL Bilateral hip strength is 4/5 grossly throughout   FUNCTIONAL TESTS:  Eval:   5 times sit to  stand: 16.43 sec MCTSIB: Condition 1: Avg of 3 trials: 30 sec, Condition 2: Avg of 3 trials: 30 sec, Condition 3: Avg of 3 trials: 30 sec, Condition 4: Avg of 3 trials: 30 sec, and Total Score: 120/120 Single Leg Stance:  Right- 7 sec, Left 11 sec  04/04/2023: 5 times sit to stand: 19.43 sec Single Leg Stance:  Right- 15.89 sec, Left 12.57 sec  04/20/2023: Total ABC score: 850 / 1600 = 53.1 % Single Leg  Stance: Right- 30 sec, Left 30 sec (but with mod sway noted)   VESTIBULAR ASSESSMENT:  SYMPTOM BEHAVIOR:  Non-Vestibular symptoms: neck pain  Type of dizziness: Spinning/Vertigo, Unsteady with head/body turns, and "World moves"  Frequency: can happen daily, some days worse than others  Duration: varies  Aggravating factors: Induced by position change: lying supine and rolling to the right and Induced by motion: looking up at the ceiling and turning head quickly  Relieving factors: head stationary  Progression of symptoms: worse  OCULOMOTOR EXAM:  Ocular Alignment: normal  Ocular ROM: No Limitations  Spontaneous Nystagmus: absent  Gaze-Induced Nystagmus: absent  Smooth Pursuits: intact  Saccades: slow    VESTIBULAR - OCULAR REFLEX:   Head Thrust positive for nystagmus   POSITIONAL TESTING: Right Dix-Hallpike: nystagmus noted and Duration: 1 min    VESTIBULAR TREATMENT:                                                                                                    DATE:  04/20/2023 ABC scale Single leg stance Recheck of smooth pursuit.  Patient with some difficulty tracking to the right, but nystagmus noted when looking down [name] to the right was initially negative.  However, with return to sitting, patient has increased dizziness and nystagmus noted.   Attempted log rolling test and patient started getting increased nystagmus just with lying supine and with attempted to roll head and patient had increasing nystagmus and dizziness and had to cease test.  Following, patient returned to sitting in chair where he had vomited.  This PT sat with patient and educated him about the importance of following back up with his PCP for work up and recommended him transition to treatment with [facility].   04/18/2023 Nustep level 5 x6 min with PT present to discuss status Tandem gait on AirEx beam down and back x4  Lateral stepping on AirEx beam down and  back x4 bilat FWD step ups onto bosu x10 bilat Side step up and over bosu x10 bilat Standing on flat side of bosu 2x1 min (in parallel balance) Dix Hallpike positive on the right side for 10-15 sec,  proceeded to canalith repositioning with Epley Maneuver x1   04/13/2023 Nustep level 5 x6 min with PT present to discuss status Standing on foam pad at barre:  hip abduction, hip extension.  2x10 each bilat Marching on trampoline x2 min Standing in kickstand position reaching forward to touch cone x10 bilat Standing with 3 cones in front performing single cone tap x5 bilat Dix Hallpike positive on the right side for 15 sec,  proceeded to canalith repositioning with Epley Maneuver x1 Seated cat/cow x10 Seated scapular retraction x10 Seated cervical retraction x10    PATIENT EDUCATION: Education details: Issued HEP Person educated: Patient Education method: Explanation, Facilities manager, and Handouts Education comprehension: verbalized understanding  HOME EXERCISE PROGRAM: Access Code: [email or web address] URL: [email or web address] Date: 03/09/2023 Prepared by: Clydie Braun Levar Fayson  Exercises - Pencil Pushups  - 1 x daily - 7 x weekly - 2 sets - 10 reps - Seated Gaze Stabilization with Head Rotation  - 1 x daily - 7 x weekly - 1 sets - 20 reps - Seated Gaze Stabilization with Head Nod  - 1 x daily - 7 x weekly - 1 sets - 20 reps - Brandt-Daroff Vestibular Exercise  - 1 x daily - 7 x weekly - 1 sets - 3 reps - Tandem Walking with Counter Support  - 1 x daily - 7 x weekly - 2 sets - 10 reps - Single Leg Stance with Support  - 1 x daily - 7 x weekly - 1 sets - 2 reps - 20 sec hold - Sit to Stand with Arms Crossed  - 1 x daily - 7 x weekly - 2 sets - 10 reps  GOALS: Goals reviewed with patient? Yes  SHORT TERM GOALS: Target date: 03/31/2023  Patient will be independent with initial HEP. Baseline: Goal status: MET on 03/21/2023  2.  Patient will report at least a 25% improvement in  dizziness symptoms. Baseline:  Goal status: MET   LONG TERM GOALS: Target date: 05/05/2023  Patient will be independent with advanced HEP to allow for self progression after discharge. Baseline:  Goal status: IN PROGRESS  2.  Patient will report at least a 75% improvement in dizziness to allow him to perform functional tasks without increased dizziness. Baseline: Reports 30% improvement on 04/13/23 Goal status: IN PROGRESS  3.  Patient will increase bilat hip strength to at least 4+/5 to allow him to perform functional tasks with improve ease. Baseline: 4/5 Goal status: IN PROGRESS  4.  Patient will report pain of no increase in back pain with various typical household tasks and with community ambulation. Baseline:  Goal status: IN PROGRESS  5.  Patient will report no falls or loss in balance within 2 weeks leading up to discharge. Baseline:  Goal status: IN PROGRESS   ASSESSMENT:  CLINICAL IMPRESSION: Mr Johnson presents to PT with continued reports of dizziness.  Patient states that in past couple of days, his dizziness does seem to have gotten worse.  Patient with initial negative [name], but when attempted a log roll test to assess horizontal BPPV, patient with increased nystagmus just going to supine position.  Patient recommended to schedule a follow up appointment with his PCP and recommended to transition care to [facility].  Patient verbalizes understanding.  OBJECTIVE IMPAIRMENTS: Abnormal gait, decreased balance, decreased strength, increased muscle spasms, postural dysfunction, and pain.   ACTIVITY LIMITATIONS: carrying, lifting, bending, stairs, and bed mobility  PARTICIPATION LIMITATIONS: driving and community activity  PERSONAL FACTORS: Past/current experiences, Time since onset of injury/illness/exacerbation, and 3 comorbidities: HTN, cataract surgery, Hx of back pain are also affecting patient's functional outcome.   REHAB POTENTIAL:  Good  CLINICAL DECISION MAKING: Evolving/moderate complexity  EVALUATION COMPLEXITY: Moderate   PLAN:  PT FREQUENCY: 2x/week  PT DURATION: 8 weeks  PLANNED INTERVENTIONS: Therapeutic exercises, Therapeutic activity, Neuromuscular re-education, Balance training, Gait training, Patient/Family education, Self Care, Joint mobilization, Joint manipulation, Stair training, Vestibular training, Canalith repositioning, Aquatic  Therapy, Dry Needling, Electrical stimulation, Spinal manipulation, Spinal mobilization, Cryotherapy, Moist heat, Taping, Traction, Ultrasound, Manual therapy, and Re-evaluation  PLAN FOR NEXT SESSION: Reassessment visit by [facility]   Reather Laurence, PT, DPT 04/20/23, 12:35 PM  [facility] [address] [place], [place] [address] Phone # [phone] Fax [phone]

## 2023-04-24 ENCOUNTER — Encounter: Payer: Self-pay | Admitting: Physical Therapy

## 2023-04-24 ENCOUNTER — Ambulatory Visit: Payer: Medicare HMO | Admitting: Physical Therapy

## 2023-04-24 DIAGNOSIS — M5459 Other low back pain: Secondary | ICD-10-CM | POA: Diagnosis not present

## 2023-04-24 DIAGNOSIS — R293 Abnormal posture: Secondary | ICD-10-CM | POA: Diagnosis not present

## 2023-04-24 DIAGNOSIS — M6281 Muscle weakness (generalized): Secondary | ICD-10-CM | POA: Diagnosis not present

## 2023-04-24 DIAGNOSIS — R2689 Other abnormalities of gait and mobility: Secondary | ICD-10-CM | POA: Diagnosis not present

## 2023-04-24 DIAGNOSIS — R42 Dizziness and giddiness: Secondary | ICD-10-CM

## 2023-04-24 NOTE — Therapy (Signed)
OUTPATIENT PHYSICAL THERAPY VESTIBULAR NOTE/RE-CERT     Patient Name: Kent Watts MRN: [id number] DOB:06/15/1950, 72 y.o., male Today's Date: 04/24/2023      END OF SESSION:  PT End of Session - 04/24/23 1413     Visit Number 11    Number of Visits 19    Date for PT Re-Evaluation 05/22/23    Authorization Type [employer] Medicare    Progress Note Due on Visit 20    PT Start Time 1319    PT Stop Time 1410    PT Time Calculation (min) 51 min    Activity Tolerance Patient tolerated treatment well    Behavior During Therapy WFL for tasks assessed/performed              Past Medical History:  Diagnosis Date   Hypertension    Past Surgical History:  Procedure Laterality Date   KNEE SURGERY Right    There are no problems to display for this patient.    REFERRING PROVIDER: Odis Luster, PA-C  REFERRING DIAG: R29.6 (ICD-10-CM) - Frequent falls  THERAPY DIAG:  Dizziness and giddiness  Abnormal posture  Other abnormalities of gait and mobility  Muscle weakness (generalized)  Other low back pain  ONSET DATE: 5 weeks ago  Rationale for Evaluation and Treatment: Rehabilitation  SUBJECTIVE:   SUBJECTIVE STATEMENT: Patient reports that he vomited after last appointment and currently feeling about the same as he was (not better/not worse). 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 Reports episodes come and go and has had episodes since initial onset. Current episode ongoing for the past 3 weeks. Reports a hx of head trauma after a fall almost a year ago. Reports imbalance. Denies recent head trauma, infection/illness, vision changes/double vision, hearing loss, tinnitus. Reports migraines in elementary school which was resolved when he got glasses. Dizziness is worse with rolling, getting up from bed, leaning forward, sitting up from a chair.    Pt accompanied by: self  PERTINENT HISTORY:  HTN, cataract surgery  PAIN:  Are you having pain? Yes: NPRS scale: 0/10 Pain location: right shoulder Pain description: aching and throbbing Aggravating factors: certain positions and worse first thing in the AM Relieving factors: unknown  PRECAUTIONS: Fall  RED FLAGS: None   WEIGHT BEARING RESTRICTIONS: No  FALLS: Has patient fallen in last 6 months? Yes. 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  Is trying not to do too much moving of items, and Leisure: spending time with granddaughter  PATIENT GOALS: To improve balance and decrease dizziness.  OBJECTIVE:       TODAY'S TREATMENT: 04/24/23  VESTIBULAR ASSESSMENT   GENERAL OBSERVATION: pt wears readers (hx of cataract surgery)   OCULOMOTOR EXAM:   Ocular Alignment: normal   Ocular ROM: No Limitations   Spontaneous Nystagmus: absent   Gaze-Induced Nystagmus: absent   Smooth Pursuits: intact   Saccades: intact   Convergence/Divergence: c/o diplopia at 1 foot away   VESTIBULAR - OCULAR REFLEX:    Slow VOR: Normal c/o " a little swirly"; slow    VOR  Cancellation: Normal   Head-Impulse Test: HIT Right: covert positive HIT Left: negative    POSITIONAL TESTING:  Right Roll Test: negative  Left Roll Test: negative  Right Sidelying: R upbeating torsional lasting ~25 sec; dizziness upon sitting up with reversal of nystagmus  Left Sidelying: L upbeating torsional lasting ~15 sec; dizziness upon sitting up  Right Dix-Hallpike: upbeating nystagmus lasting ~10 sec followed by downbeat nystagmus lasting ~15 sec     Activity Comments  R epley  L  upbeating torsional nystagmus lasting ~20 sec, then possible small amplitude downbeating; retropulsion and dizziness upon sitting up    HOME EXERCISE PROGRAM Last updated: 04/24/23 Access Code: [email or web address] URL: [email or web address] Date: 04/24/2023 Prepared by: [facility] - Outpatient  Rehab - [facility]  Exercises - Brandt-Daroff Vestibular Exercise  - 1 x daily - 5 x weekly - 2 sets - 3-5 reps   PATIENT EDUCATION: Education details: edu on BPPV risk factors such as vitamin D deficiency and poor hydration; handout and edu on BPPV, HEP to be performed with wife for safety Person educated: Patient Education method: Explanation, Demonstration, Tactile cues, Verbal cues, and Handouts Education comprehension: verbalized understanding and returned demonstration   Note: Objective measures were completed at Evaluation unless otherwise noted.  DIAGNOSTIC FINDINGS:  Lumbar Radiograph on 03/30/2022: IMPRESSION: 1. No acute fracture. 2. 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 3. Nonobstructive right renal calculus.  Cervical CT Scan on 03/29/2022: IMPRESSION: CT of the head: Small focus of hemorrhage along the falx just to the left of the midline CT of the cervical spine: Multilevel degenerative change without acute abnormality.  COGNITION: Overall cognitive status: Within functional limits for tasks assessed   SENSATION: Reports some numbness/tingling in bilat feet, especially at night  POSTURE:  rounded shoulders and forward head  Cervical ROM:    Eval:  limited 25% with pain and dizziness when looking up  STRENGTH:  Eval: Bilat UE strength is WFL Bilat quad strength is WFL Bilateral hip strength is 4/5 grossly throughout   FUNCTIONAL TESTS:  Eval:   5 times sit to stand: 16.43 sec MCTSIB: Condition 1: Avg of 3 trials: 30 sec, Condition 2: Avg of 3 trials: 30 sec, Condition 3: Avg of 3 trials: 30 sec, Condition 4: Avg of 3 trials: 30 sec, and  Total Score: 120/120 Single Leg Stance:  Right- 7 sec, Left 11 sec  04/04/2023: 5 times sit to stand: 19.43 sec Single Leg Stance:  Right- 15.89 sec, Left 12.57 sec   VESTIBULAR ASSESSMENT:  SYMPTOM BEHAVIOR:  Non-Vestibular symptoms: neck pain  Type of dizziness: Spinning/Vertigo, Unsteady with head/body turns, and "World moves"  Frequency: can happen daily, some days worse than others  Duration: varies  Aggravating factors: Induced by position change: lying supine and rolling to the right and Induced by motion: looking up at the ceiling and turning head quickly  Relieving factors: head stationary  Progression of symptoms: worse  OCULOMOTOR EXAM:  Ocular Alignment: normal  Ocular ROM: No Limitations  Spontaneous Nystagmus: absent  Gaze-Induced Nystagmus: absent  Smooth Pursuits: intact  Saccades: slow    VESTIBULAR - OCULAR REFLEX:   Head Thrust positive for nystagmus   POSITIONAL TESTING: Right Dix-Hallpike: nystagmus noted and Duration: 1 min   PATIENT EDUCATION: Education details: Issued HEP Person educated: Patient Education method: Explanation, Demonstration, and Handouts Education comprehension: verbalized understanding  HOME EXERCISE PROGRAM: Access Code: [email or web address] URL: [email or web address] Date: 03/09/2023 Prepared by: Clydie Braun Menke  Exercises - Pencil Pushups  - 1 x daily -  7 x weekly - 2 sets - 10 reps - Seated Gaze Stabilization with Head Rotation  - 1 x daily - 7 x weekly - 1 sets - 20 reps - Seated Gaze Stabilization with Head Nod  - 1 x daily - 7 x weekly - 1 sets - 20 reps - Brandt-Daroff Vestibular Exercise  - 1 x daily - 7 x weekly - 1 sets - 3 reps - Tandem Walking with Counter Support  - 1 x daily - 7 x weekly - 2 sets - 10 reps - Single Leg Stance with Support  - 1 x daily - 7 x weekly - 1 sets - 2 reps - 20 sec hold - Sit to Stand with Arms Crossed  - 1 x daily - 7 x weekly - 2 sets - 10 reps  GOALS: Goals reviewed with  patient? Yes  SHORT TERM GOALS: Target date: 03/31/2023  Patient will be independent with initial HEP. Baseline: Goal status: MET on 03/21/2023  2.  Patient will report at least a 25% improvement in dizziness symptoms. Baseline:  Goal status: MET   LONG TERM GOALS: Target date: 112/16/2024  Patient will be independent with advanced HEP to allow for self progression after discharge. Baseline:  Goal status: IN PROGRESS 04/24/23  2.  Patient will report at least a 75% improvement in dizziness to allow him to perform functional tasks without increased dizziness. Baseline: Reports 30% improvement on 04/13/23 Goal status: IN PROGRESS  3.  Patient will increase bilat hip strength to at least 4+/5 to allow him to perform functional tasks with improve ease. Baseline: 4/5 Goal status: IN PROGRESS 04/24/23  4.  Patient will report pain of no increase in back pain with various typical household tasks and with community ambulation. Baseline:  Goal status: IN PROGRESS 04/24/23  5.  Patient will report no falls or loss in balance within 2 weeks leading up to discharge. Baseline:  Goal status: IN PROGRESS 04/24/23   ASSESSMENT:  CLINICAL IMPRESSION: Patient arrived to session with report of continued dizziness, worst with position changes. 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 Patient treated with R Epley x1. Educated on habituation HEP to be performed with wife for safety and on BPPV. Would benefit from additional skilled PT services 1-2x/week for 4 weeks to address dizziness.   OBJECTIVE IMPAIRMENTS: Abnormal gait, decreased balance, decreased strength, increased muscle spasms, postural dysfunction, and pain.   ACTIVITY LIMITATIONS: carrying, lifting, bending, stairs, and bed mobility  PARTICIPATION LIMITATIONS: driving and community activity  PERSONAL FACTORS: Past/current experiences, Time since onset of  injury/illness/exacerbation, and 3 comorbidities: HTN, cataract surgery, Hx of back pain are also affecting patient's functional outcome.   REHAB POTENTIAL: Good  CLINICAL DECISION MAKING: Evolving/moderate complexity  EVALUATION COMPLEXITY: Moderate   PLAN:  PT FREQUENCY: 2x/week  PT DURATION: 8 weeks  PLANNED INTERVENTIONS: Therapeutic exercises, Therapeutic activity, Neuromuscular re-education, Balance training, Gait training, Patient/Family education, Self Care, Joint mobilization, Joint manipulation, Stair training, Vestibular training, Canalith repositioning, Aquatic Therapy, Dry Needling, Electrical stimulation, Spinal manipulation, Spinal mobilization, Cryotherapy, Moist heat, Taping, Traction, Ultrasound, Manual therapy, and Re-evaluation  PLAN FOR NEXT SESSION: reassess positional testing- if unsuccessful, try vibration, habituation, or other maneuvers?    Anette Guarneri, PT, DPT 04/24/23 2:23 PM  [facility] [address] [place], [address] [place], [place] [address] Phone # [phone] Fax # [phone]

## 2023-04-25 ENCOUNTER — Encounter: Payer: Medicare HMO | Admitting: Rehabilitative and Restorative Service Providers"

## 2023-04-25 NOTE — Therapy (Signed)
OUTPATIENT PHYSICAL THERAPY VESTIBULAR NOTE     Patient Name: Kent Watts MRN: [id number] DOB:10/29/1950, 72 y.o., male Today's Date: 04/26/2023      END OF SESSION:  PT End of Session - 04/26/23 1345     Visit Number 12    Number of Visits 19    Date for PT Re-Evaluation 05/22/23    Authorization Type [employer] Medicare    Progress Note Due on Visit 20    PT Start Time 1319    PT Stop Time 1346    PT Time Calculation (min) 27 min    Activity Tolerance Patient tolerated treatment well    Behavior During Therapy WFL for tasks assessed/performed               Past Medical History:  Diagnosis Date   Hypertension    Past Surgical History:  Procedure Laterality Date   KNEE SURGERY Right    There are no problems to display for this patient.    REFERRING PROVIDER: Odis Luster, PA-C  REFERRING DIAG: R29.6 (ICD-10-CM) - Frequent falls  THERAPY DIAG:  Dizziness and giddiness  Abnormal posture  Other abnormalities of gait and mobility  Muscle weakness (generalized)  Other low back pain  ONSET DATE: 5 weeks ago  Rationale for Evaluation and Treatment: Rehabilitation  SUBJECTIVE:   SUBJECTIVE STATEMENT: Reports that he is "still having spinning issues" with lying down and getting up and changing positions.  Reports that he feels like it is not as bad as it was. Was able to perform HEP.    Pt accompanied by: self  PERTINENT HISTORY: HTN, cataract surgery  PAIN:  Are you having pain? Yes: NPRS scale: 0/10 Pain location: right shoulder Pain description: aching and throbbing Aggravating factors: certain positions and worse first thing in the AM Relieving factors: unknown  PRECAUTIONS: Fall  RED FLAGS: None   WEIGHT BEARING RESTRICTIONS: No  FALLS: Has patient fallen in last 6 months? Yes. 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  Is trying not to do too much moving of items, and Leisure: spending time with granddaughter  PATIENT GOALS: To improve balance and decrease dizziness.  OBJECTIVE:     TODAY'S TREATMENT: 04/26/23 Activity Comments  brandt daroff L L upbeating torsional nystagmus lasting ~20 sec, dizziness and reversal upon sitting up  brandt daroff R  R upbeating torsional nystagmus lasting ~5 sec, less dizziness and reversal upon sitting up  sitting horizontal and vertical VOR 30" Difficulty coordinating, cues to slow down. Reported mild increase in dizziness "but tolerable"; worse horizontally   R DH R upbeating torsional nystagmus lasting ~10 sec  R Epley  L upbeating torsional nystagmus lasting ~20 sec upon L head turn; downbeating nystagmus upon sitting up (reversal) but tolerated well.   R [facility] Negative     HOME EXERCISE PROGRAM Last updated: 04/26/23 Access Code: [email or web address] URL: [email or web address] Date: 04/26/2023 Prepared by: [facility]  Exercises - Brandt-Daroff Vestibular Exercise  - 1 x daily - 5 x weekly - 2 sets - 3-5 reps - Seated Gaze Stabilization with Head Rotation  - 1  x daily - 5 x weekly - 2-3 sets - 30 sec hold     PATIENT EDUCATION: Education details: HEP update; encouraged bringing cane into sessions d/t unsteadiness, post-CRM expectations Person educated: Patient Education method: Explanation, Demonstration, Tactile cues, Verbal cues, and Handouts Education comprehension: verbalized understanding and returned demonstration      04/24/23: VESTIBULAR ASSESSMENT   GENERAL OBSERVATION: pt wears readers (hx of cataract surgery)   OCULOMOTOR EXAM:   Ocular  Alignment: normal   Ocular ROM: No Limitations   Spontaneous Nystagmus: absent   Gaze-Induced Nystagmus: absent   Smooth Pursuits: intact   Saccades: intact   Convergence/Divergence: c/o diplopia at 1 foot away   VESTIBULAR - OCULAR REFLEX:    Slow VOR: Normal c/o " a little swirly"; slow    VOR Cancellation: Normal   Head-Impulse Test: HIT Right: covert positive HIT Left: negative    POSITIONAL TESTING:  Right Roll Test: negative  Left Roll Test: negative  Right Sidelying: R upbeating torsional lasting ~25 sec; dizziness upon sitting up with reversal of nystagmus  Left Sidelying: L upbeating torsional lasting ~15 sec; dizziness upon sitting up  Right Dix-Hallpike: upbeating nystagmus lasting ~10 sec followed by downbeat nystagmus lasting ~15 sec     Note: Objective measures were completed at Evaluation unless otherwise noted.  DIAGNOSTIC FINDINGS:  Lumbar Radiograph on 03/30/2022: IMPRESSION: 1. No acute fracture. 2. 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 3. Nonobstructive right renal calculus.  Cervical CT Scan on 03/29/2022: IMPRESSION: CT of the head: Small focus of hemorrhage along the falx just to the left of the midline CT of the cervical spine: Multilevel degenerative change without acute abnormality.  COGNITION: Overall cognitive status: Within functional limits for tasks assessed   SENSATION: Reports some numbness/tingling in bilat feet, especially at night  POSTURE:  rounded shoulders and forward head  Cervical ROM:    Eval:  limited 25% with pain and dizziness when looking up  STRENGTH:  Eval: Bilat UE strength is WFL Bilat quad strength is WFL Bilateral hip strength is 4/5 grossly throughout   FUNCTIONAL TESTS:  Eval:   5 times sit to stand: 16.43 sec MCTSIB: Condition 1: Avg of 3 trials: 30 sec, Condition 2: Avg of 3 trials: 30 sec, Condition 3: Avg of 3 trials: 30 sec, Condition 4: Avg of 3 trials: 30 sec, and Total Score:  120/120 Single Leg Stance:  Right- 7 sec, Left 11 sec  04/04/2023: 5 times sit to stand: 19.43 sec Single Leg Stance:  Right- 15.89 sec, Left 12.57 sec   VESTIBULAR ASSESSMENT:  SYMPTOM BEHAVIOR:  Non-Vestibular symptoms: neck pain  Type of dizziness: Spinning/Vertigo, Unsteady with head/body turns, and "World moves"  Frequency: can happen daily, some days worse than others  Duration: varies  Aggravating factors: Induced by position change: lying supine and rolling to the right and Induced by motion: looking up at the ceiling and turning head quickly  Relieving factors: head stationary  Progression of symptoms: worse  OCULOMOTOR EXAM:  Ocular Alignment: normal  Ocular ROM: No Limitations  Spontaneous Nystagmus: absent  Gaze-Induced Nystagmus: absent  Smooth Pursuits: intact  Saccades: slow    VESTIBULAR - OCULAR REFLEX:   Head Thrust positive for nystagmus   POSITIONAL TESTING: Right Dix-Hallpike: nystagmus noted and Duration: 1 min   PATIENT EDUCATION: Education details: Issued HEP Person educated: Patient Education method: Explanation, Demonstration, and Handouts Education comprehension: verbalized understanding  HOME EXERCISE PROGRAM: Access Code: [email or web address] URL: [email or web address] Date: 03/09/2023 Prepared by: Reather Laurence  Exercises - Pencil Pushups  - 1 x daily - 7 x weekly - 2 sets - 10 reps - Seated Gaze Stabilization with Head Rotation  - 1 x daily - 7 x weekly - 1 sets - 20 reps - Seated Gaze Stabilization with Head Nod  - 1 x daily - 7 x weekly - 1 sets - 20 reps - Brandt-Daroff Vestibular Exercise  - 1 x daily - 7 x weekly - 1 sets - 3 reps - Tandem Walking with Counter Support  - 1 x daily - 7 x weekly - 2 sets - 10 reps - Single Leg Stance with Support  - 1 x daily - 7 x weekly - 1 sets - 2 reps - 20 sec hold - Sit to Stand with Arms Crossed  - 1 x daily - 7 x weekly - 2 sets - 10 reps  GOALS: Goals reviewed with patient?  Yes  SHORT TERM GOALS: Target date: 03/31/2023  Patient will be independent with initial HEP. Baseline: Goal status: MET on 03/21/2023  2.  Patient will report at least a 25% improvement in dizziness symptoms. Baseline:  Goal status: MET   LONG TERM GOALS: Target date: 112/16/2024  Patient will be independent with advanced HEP to allow for self progression after discharge. Baseline:  Goal status: IN PROGRESS 04/24/23  2.  Patient will report at least a 75% improvement in dizziness to allow him to perform functional tasks without increased dizziness. Baseline: Reports 30% improvement on 04/13/23 Goal status: IN PROGRESS  3.  Patient will increase bilat hip strength to at least 4+/5 to allow him to perform functional tasks with improve ease. Baseline: 4/5 Goal status: IN PROGRESS 04/24/23  4.  Patient will report pain of no increase in back pain with various typical household tasks and with community ambulation. Baseline:  Goal status: IN PROGRESS 04/24/23  5.  Patient will report no falls or loss in balance within 2 weeks leading up to discharge. Baseline:  Goal status: IN PROGRESS 04/24/23   ASSESSMENT:  CLINICAL IMPRESSION: Patient arrived to session with report of some improvement in dizziness. Reviewed habituation from last session which revealed remaining B posterior canalithiasis but improved symptoms on the R today. Initiated VOR with patient reporting dizziness with horizontal rather than vertical. Treated patient with R Epley x 1, with subsequent testing negative. Encouraged patient to use cane to/from sessions d/t unsteadiness. Patient reported understanding and without complaints upon leaving.   OBJECTIVE IMPAIRMENTS: Abnormal gait, decreased balance, decreased strength, increased muscle spasms, postural dysfunction, and pain.   ACTIVITY LIMITATIONS: carrying, lifting, bending, stairs, and bed mobility  PARTICIPATION LIMITATIONS: driving and community  activity  PERSONAL FACTORS: Past/current experiences, Time since onset of injury/illness/exacerbation, and 3 comorbidities: HTN, cataract surgery, Hx of back pain are also affecting patient's functional outcome.   REHAB POTENTIAL: Good  CLINICAL DECISION MAKING: Evolving/moderate complexity  EVALUATION COMPLEXITY: Moderate   PLAN:  PT FREQUENCY: 2x/week  PT DURATION: 8 weeks  PLANNED INTERVENTIONS: Therapeutic exercises, Therapeutic activity, Neuromuscular re-education, Balance training, Gait training, Patient/Family education, Self Care, Joint mobilization, Joint manipulation, Stair training, Vestibular training, Canalith repositioning, Aquatic Therapy, Dry Needling, Electrical stimulation, Spinal manipulation, Spinal mobilization, Cryotherapy, Moist heat, Taping, Traction, Ultrasound, Manual therapy, and Re-evaluation  PLAN FOR NEXT SESSION: reassess positional testing- if unsuccessful, try vibration, habituation, or other maneuvers?    Anette Guarneri, PT, DPT 04/26/23 1:48 PM  [facility] [address] [place], [address] [place], [place] [address] Phone # [phone] Fax # [phone]

## 2023-04-26 ENCOUNTER — Ambulatory Visit: Payer: Medicare HMO | Admitting: Physical Therapy

## 2023-04-26 ENCOUNTER — Encounter: Payer: Self-pay | Admitting: Physical Therapy

## 2023-04-26 DIAGNOSIS — R293 Abnormal posture: Secondary | ICD-10-CM

## 2023-04-26 DIAGNOSIS — R42 Dizziness and giddiness: Secondary | ICD-10-CM | POA: Diagnosis not present

## 2023-04-26 DIAGNOSIS — M5459 Other low back pain: Secondary | ICD-10-CM | POA: Diagnosis not present

## 2023-04-26 DIAGNOSIS — M6281 Muscle weakness (generalized): Secondary | ICD-10-CM

## 2023-04-26 DIAGNOSIS — R2689 Other abnormalities of gait and mobility: Secondary | ICD-10-CM | POA: Diagnosis not present

## 2023-04-27 ENCOUNTER — Encounter: Payer: Medicare HMO | Admitting: Rehabilitative and Restorative Service Providers"

## 2023-04-28 NOTE — Therapy (Signed)
OUTPATIENT PHYSICAL THERAPY VESTIBULAR NOTE     Patient Name: Kent Watts MRN: [id number] DOB:[birth year], 72 y.o., male Today's Date: 05/01/2023      END OF SESSION:  PT End of Session - 05/01/23 1447     Visit Number 13    Number of Visits 19    Date for PT Re-Evaluation 05/22/23    Authorization Type [employer]    Progress Note Due on Visit 20    PT Start Time 1403    PT Stop Time 1446    PT Time Calculation (min) 43 min    Activity Tolerance Patient tolerated treatment well    Behavior During Therapy WFL for tasks assessed/performed                Past Medical History:  Diagnosis Date   Hypertension    Past Surgical History:  Procedure Laterality Date   KNEE SURGERY Right    There are no problems to display for this patient.    REFERRING PROVIDER: Odis Luster, PA-C  REFERRING DIAG: R29.6 (ICD-10-CM) - Frequent falls  THERAPY DIAG:  Dizziness and giddiness  Abnormal posture  Other abnormalities of gait and mobility  Muscle weakness (generalized)  Other low back pain  ONSET DATE: 5 weeks ago  Rationale for Evaluation and Treatment: Rehabilitation  SUBJECTIVE:   SUBJECTIVE STATEMENT: Reports "it's not any worse, it might be a little better."    Pt accompanied by: self  PERTINENT HISTORY: HTN, cataract surgery  PAIN:  Are you having pain? Yes: NPRS scale: 0/10 Pain location: right shoulder Pain description: aching and throbbing Aggravating factors: certain positions and worse first thing in the AM Relieving factors: unknown  PRECAUTIONS: Fall  RED FLAGS: None   WEIGHT BEARING RESTRICTIONS: No  FALLS: Has patient fallen in last 6 months? Yes. 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  Is trying not to do too much moving of items, and Leisure: spending time with granddaughter  PATIENT GOALS: To improve balance and decrease dizziness.  OBJECTIVE:     TODAY'S TREATMENT: 05/01/23 Activity Comments  R sidelying test Negative; c/o dizziness upon sitting up  L sidelying test  L upbeating torsional nystagmus lasting ~15 sec; dizziness upon sitting up   sitting anterior canal habituation 1x each  C/o mild dizziness upon sitting up   L DH L upbeating torsional nystagmus lasting ~15 sec;  L epley  R upbeating torsional nystagmus upon R head turn; dizziness upon sitting up  L sidelying test  Horizontal nystagmus lasting 20 sec  L BBQ roll Tolerated well   L roll Apogeotropic persisting   R roll Less intense apogeotropic lasting ~  Gufoni for apogeotropic (laying on R side) Tolerated well   L roll  negative  L sidelying test Negative   R sidelying test R upbeating torsional nystagmus lasting 10 sec          HOME EXERCISE PROGRAM Access Code: [email or web address] URL: [email or web address] Date: 05/01/2023 Prepared by: [facility] - Outpatient  Rehab - [facility]  Exercises - Brandt-Daroff Vestibular Exercise  - 1 x daily - 5 x weekly - 2 sets -  3-5 reps - Seated Gaze Stabilization with Head Rotation  - 1 x daily - 5 x weekly - 2-3 sets - 30 sec hold - Seated Nose to Right Knee Vestibular Habituation  - 1 x daily - 5 x weekly - 2 sets - 3-5 reps - 10 sec hold - Seated Nose to Left Knee Vestibular Habituation  - 1 x daily - 5 x weekly - 2 sets - 3-5 reps - 10 sec hold   PATIENT EDUCATION: Education details: HEP update, edu on repositioning of otoconia into horizontal canal  Person educated: Patient Education method: Explanation, Demonstration, Tactile cues, Verbal cues, and Handouts Education comprehension:  verbalized understanding and returned demonstration      04/24/23: VESTIBULAR ASSESSMENT   GENERAL OBSERVATION: pt wears readers (hx of cataract surgery)   OCULOMOTOR EXAM:   Ocular Alignment: normal   Ocular ROM: No Limitations   Spontaneous Nystagmus: absent   Gaze-Induced Nystagmus: absent   Smooth Pursuits: intact   Saccades: intact   Convergence/Divergence: c/o diplopia at 1 foot away   VESTIBULAR - OCULAR REFLEX:    Slow VOR: Normal c/o " a little swirly"; slow    VOR Cancellation: Normal   Head-Impulse Test: HIT Right: covert positive HIT Left: negative    POSITIONAL TESTING:  Right Roll Test: negative  Left Roll Test: negative  Right Sidelying: R upbeating torsional lasting ~25 sec; dizziness upon sitting up with reversal of nystagmus  Left Sidelying: L upbeating torsional lasting ~15 sec; dizziness upon sitting up  Right Dix-Hallpike: upbeating nystagmus lasting ~10 sec followed by downbeat nystagmus lasting ~15 sec     Note: Objective measures were completed at Evaluation unless otherwise noted.  DIAGNOSTIC FINDINGS:  Lumbar Radiograph on 03/30/2022: IMPRESSION: 1. No acute fracture. 2. 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 3. Nonobstructive right renal calculus.  Cervical CT Scan on 03/29/2022: IMPRESSION: CT of the head: Small focus of hemorrhage along the falx just to the left of the midline CT of the cervical spine: Multilevel degenerative change without acute abnormality.  COGNITION: Overall cognitive status: Within functional limits for tasks assessed   SENSATION: Reports some numbness/tingling in bilat feet, especially at night  POSTURE:  rounded shoulders and forward head  Cervical ROM:    Eval:  limited 25% with pain and dizziness when looking up  STRENGTH:  Eval: Bilat UE strength is WFL Bilat quad strength is WFL Bilateral hip strength is 4/5 grossly throughout   FUNCTIONAL TESTS:  Eval:   5 times sit to stand:  16.43 sec MCTSIB: Condition 1: Avg of 3 trials: 30 sec, Condition 2: Avg of 3 trials: 30 sec, Condition 3: Avg of 3 trials: 30 sec, Condition 4: Avg of 3 trials: 30 sec, and Total Score: 120/120 Single Leg Stance:  Right- 7 sec, Left 11 sec  04/04/2023: 5 times sit to stand: 19.43 sec Single Leg Stance:  Right- 15.89 sec, Left 12.57 sec   VESTIBULAR ASSESSMENT:  SYMPTOM BEHAVIOR:  Non-Vestibular symptoms: neck pain  Type of dizziness: Spinning/Vertigo, Unsteady with head/body turns, and "World moves"  Frequency: can happen daily, some days worse than others  Duration: varies  Aggravating factors: Induced by position change: lying supine and rolling to the right and Induced by motion: looking up at the ceiling and turning head quickly  Relieving factors: head stationary  Progression of symptoms: worse  OCULOMOTOR EXAM:  Ocular Alignment: normal  Ocular ROM: No Limitations  Spontaneous Nystagmus: absent  Gaze-Induced Nystagmus: absent  Smooth Pursuits: intact  Saccades: slow    VESTIBULAR - OCULAR REFLEX:   Head Thrust positive for nystagmus   POSITIONAL TESTING: Right Dix-Hallpike: nystagmus noted and Duration: 1 min   PATIENT EDUCATION: Education details: Issued HEP Person educated: Patient Education method: Explanation, Demonstration, and Handouts Education comprehension: verbalized understanding  HOME EXERCISE PROGRAM: Access Code: [email or web address] URL: [email or web address] Date: 03/09/2023 Prepared by: Clydie Braun Menke  Exercises - Pencil Pushups  - 1 x daily - 7 x weekly - 2 sets - 10 reps - Seated Gaze Stabilization with Head Rotation  - 1 x daily - 7 x weekly - 1 sets - 20 reps - Seated Gaze Stabilization with Head Nod  - 1 x daily - 7 x weekly - 1 sets - 20 reps - Brandt-Daroff Vestibular Exercise  - 1 x daily - 7 x weekly - 1 sets - 3 reps - Tandem Walking with Counter Support  - 1 x daily - 7 x weekly - 2 sets - 10 reps - Single Leg Stance with  Support  - 1 x daily - 7 x weekly - 1 sets - 2 reps - 20 sec hold - Sit to Stand with Arms Crossed  - 1 x daily - 7 x weekly - 2 sets - 10 reps  GOALS: Goals reviewed with patient? Yes  SHORT TERM GOALS: Target date: 03/31/2023  Patient will be independent with initial HEP. Baseline: Goal status: MET on 03/21/2023  2.  Patient will report at least a 25% improvement in dizziness symptoms. Baseline:  Goal status: MET   LONG TERM GOALS: Target date: 112/16/2024  Patient will be independent with advanced HEP to allow for self progression after discharge. Baseline:  Goal status: IN PROGRESS 04/24/23  2.  Patient will report at least a 75% improvement in dizziness to allow him to perform functional tasks without increased dizziness. Baseline: Reports 30% improvement on 04/13/23 Goal status: IN PROGRESS  3.  Patient will increase bilat hip strength to at least 4+/5 to allow him to perform functional tasks with improve ease. Baseline: 4/5 Goal status: IN PROGRESS 04/24/23  4.  Patient will report pain of no increase in back pain with various typical household tasks and with community ambulation. Baseline:  Goal status: IN PROGRESS 04/24/23  5.  Patient will report no falls or loss in balance within 2 weeks leading up to discharge. Baseline:  Goal status: IN PROGRESS 04/24/23   ASSESSMENT:  CLINICAL IMPRESSION: Patient arrived to session with report of mildly improved dizziness. Positional testing revealed remaining L posterior canalithiasis. Upon treating this with L epley, patient demonstrated nystagmus indicative of return of R posteirore canalithiasis. BPPV repositioned to horizontal canal, thus treated with apogeotropic Gufoni for R ear, which was successful. Upon retest, patient still with R posterior BPPV remaining at end of session. Patient tolerated session well.   OBJECTIVE IMPAIRMENTS: Abnormal gait, decreased balance, decreased strength, increased muscle spasms,  postural dysfunction, and pain.   ACTIVITY LIMITATIONS: carrying, lifting, bending, stairs, and bed mobility  PARTICIPATION LIMITATIONS: driving and community activity  PERSONAL FACTORS: Past/current experiences, Time since onset of injury/illness/exacerbation, and 3 comorbidities: HTN, cataract surgery, Hx of back pain are also affecting patient's functional outcome.   REHAB POTENTIAL: Good  CLINICAL DECISION MAKING: Evolving/moderate complexity  EVALUATION COMPLEXITY: Moderate   PLAN:  PT FREQUENCY: 2x/week  PT DURATION: 8 weeks  PLANNED INTERVENTIONS: Therapeutic exercises, Therapeutic activity, Neuromuscular re-education, Balance training, Gait training, Patient/Family education, Self Care, Joint mobilization, Joint manipulation, Stair training, Vestibular training, Canalith repositioning, Aquatic  Therapy, Dry Needling, Electrical stimulation, Spinal manipulation, Spinal mobilization, Cryotherapy, Moist heat, Taping, Traction, Ultrasound, Manual therapy, and Re-evaluation  PLAN FOR NEXT SESSION: reassess positional testing- if unsuccessful, try vibration, habituation, or other maneuvers?    Anette Guarneri, PT, DPT 05/01/23 2:48 PM  [facility] at [facility] [address] [place], [address] [place], [place] [address] Phone # [phone] Fax # [phone]

## 2023-05-01 ENCOUNTER — Encounter: Payer: Self-pay | Admitting: Physical Therapy

## 2023-05-01 ENCOUNTER — Ambulatory Visit: Payer: Medicare HMO | Admitting: Physical Therapy

## 2023-05-01 ENCOUNTER — Encounter: Payer: Medicare HMO | Admitting: Rehabilitative and Restorative Service Providers"

## 2023-05-01 DIAGNOSIS — M5459 Other low back pain: Secondary | ICD-10-CM | POA: Diagnosis not present

## 2023-05-01 DIAGNOSIS — R293 Abnormal posture: Secondary | ICD-10-CM | POA: Diagnosis not present

## 2023-05-01 DIAGNOSIS — M6281 Muscle weakness (generalized): Secondary | ICD-10-CM | POA: Diagnosis not present

## 2023-05-01 DIAGNOSIS — R42 Dizziness and giddiness: Secondary | ICD-10-CM

## 2023-05-01 DIAGNOSIS — R2689 Other abnormalities of gait and mobility: Secondary | ICD-10-CM | POA: Diagnosis not present

## 2023-05-02 NOTE — Therapy (Incomplete)
OUTPATIENT PHYSICAL THERAPY VESTIBULAR NOTE     Patient Name: Kent Watts MRN: [id number] DOB:1951/05/27, 72 y.o., male Today's Date: 05/02/2023      END OF SESSION:       Past Medical History:  Diagnosis Date   Hypertension    Past Surgical History:  Procedure Laterality Date   KNEE SURGERY Right    There are no problems to display for this patient.    REFERRING PROVIDER: Odis Luster, PA-C  REFERRING DIAG: R29.6 (ICD-10-CM) - Frequent falls  THERAPY DIAG:  No diagnosis found.  ONSET DATE: 5 weeks ago  Rationale for Evaluation and Treatment: Rehabilitation  SUBJECTIVE:   SUBJECTIVE STATEMENT: Reports "it's not any worse, it might be a little better."    Pt accompanied by: self  PERTINENT HISTORY: HTN, cataract surgery  PAIN:  Are you having pain? Yes: NPRS scale: 0/10 Pain location: right shoulder Pain description: aching and throbbing Aggravating factors: certain positions and worse first thing in the AM Relieving factors: unknown  PRECAUTIONS: Fall  RED FLAGS: None   WEIGHT BEARING RESTRICTIONS: No  FALLS: Has patient fallen in last 6 months? Yes. 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  Is trying not to do too much moving of items, and Leisure: spending time with granddaughter  PATIENT GOALS: To improve balance and decrease dizziness.  OBJECTIVE:    TODAY'S TREATMENT: 05/08/23 Activity Comments                       TODAY'S TREATMENT: 05/01/23 Activity Comments  R sidelying test Negative; c/o dizziness upon sitting up  L sidelying test  L upbeating  torsional nystagmus lasting ~15 sec; dizziness upon sitting up   sitting anterior canal habituation 1x each  C/o mild dizziness upon sitting up   L DH L upbeating torsional nystagmus lasting ~15 sec;  L epley  R upbeating torsional nystagmus upon R head turn; dizziness upon sitting up  L sidelying test  Horizontal nystagmus lasting 20 sec  L BBQ roll Tolerated well   L roll Apogeotropic persisting   R roll Less intense apogeotropic lasting ~  Gufoni for apogeotropic (laying on R side) Tolerated well   L roll  negative  L sidelying test Negative   R sidelying test R upbeating torsional nystagmus lasting 10 sec          HOME EXERCISE PROGRAM Access Code: [email or web address] URL: [email or web address] Date: 05/01/2023 Prepared by: [facility] - Outpatient  Rehab - [facility]  Exercises - Brandt-Daroff Vestibular Exercise  - 1 x daily - 5 x weekly - 2 sets - 3-5 reps - Seated Gaze Stabilization with Head Rotation  - 1 x daily - 5 x weekly - 2-3 sets - 30 sec hold - Seated Nose to Right Knee Vestibular Habituation  - 1 x daily - 5 x weekly - 2 sets - 3-5 reps - 10 sec hold - Seated Nose to Left Knee Vestibular Habituation  - 1 x daily - 5 x weekly - 2 sets - 3-5 reps - 10 sec hold   PATIENT EDUCATION: Education details: HEP update, edu on  repositioning of otoconia into horizontal canal  Person educated: Patient Education method: Explanation, Demonstration, Tactile cues, Verbal cues, and Handouts Education comprehension: verbalized understanding and returned demonstration      04/24/23: VESTIBULAR ASSESSMENT   GENERAL OBSERVATION: pt wears readers (hx of cataract surgery)   OCULOMOTOR EXAM:   Ocular Alignment: normal   Ocular ROM: No Limitations   Spontaneous Nystagmus: absent   Gaze-Induced Nystagmus: absent   Smooth Pursuits: intact   Saccades: intact   Convergence/Divergence: c/o diplopia at 1 foot away   VESTIBULAR - OCULAR REFLEX:    Slow VOR: Normal  c/o " a little swirly"; slow    VOR Cancellation: Normal   Head-Impulse Test: HIT Right: covert positive HIT Left: negative    POSITIONAL TESTING:  Right Roll Test: negative  Left Roll Test: negative  Right Sidelying: R upbeating torsional lasting ~25 sec; dizziness upon sitting up with reversal of nystagmus  Left Sidelying: L upbeating torsional lasting ~15 sec; dizziness upon sitting up  Right Dix-Hallpike: upbeating nystagmus lasting ~10 sec followed by downbeat nystagmus lasting ~15 sec     Note: Objective measures were completed at Evaluation unless otherwise noted.  DIAGNOSTIC FINDINGS:  Lumbar Radiograph on 03/30/2022: IMPRESSION: 1. No acute fracture. 2. 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 3. Nonobstructive right renal calculus.  Cervical CT Scan on 03/29/2022: IMPRESSION: CT of the head: Small focus of hemorrhage along the falx just to the left of the midline CT of the cervical spine: Multilevel degenerative change without acute abnormality.  COGNITION: Overall cognitive status: Within functional limits for tasks assessed   SENSATION: Reports some numbness/tingling in bilat feet, especially at night  POSTURE:  rounded shoulders and forward head  Cervical ROM:    Eval:  limited 25% with pain and dizziness when looking up  STRENGTH:  Eval: Bilat UE strength is WFL Bilat quad strength is WFL Bilateral hip strength is 4/5 grossly throughout   FUNCTIONAL TESTS:  Eval:   5 times sit to stand: 16.43 sec MCTSIB: Condition 1: Avg of 3 trials: 30 sec, Condition 2: Avg of 3 trials: 30 sec, Condition 3: Avg of 3 trials: 30 sec, Condition 4: Avg of 3 trials: 30 sec, and Total Score: 120/120 Single Leg Stance:  Right- 7 sec, Left 11 sec  04/04/2023: 5 times sit to stand: 19.43 sec Single Leg Stance:  Right- 15.89 sec, Left 12.57 sec   VESTIBULAR ASSESSMENT:  SYMPTOM BEHAVIOR:  Non-Vestibular symptoms: neck pain  Type of dizziness:  Spinning/Vertigo, Unsteady with head/body turns, and "World moves"  Frequency: can happen daily, some days worse than others  Duration: varies  Aggravating factors: Induced by position change: lying supine and rolling to the right and Induced by motion: looking up at the ceiling and turning head quickly  Relieving factors: head stationary  Progression of symptoms: worse  OCULOMOTOR EXAM:  Ocular Alignment: normal  Ocular ROM: No Limitations  Spontaneous Nystagmus: absent  Gaze-Induced Nystagmus: absent  Smooth Pursuits: intact  Saccades: slow    VESTIBULAR - OCULAR REFLEX:   Head Thrust positive for nystagmus   POSITIONAL TESTING: Right Dix-Hallpike: nystagmus noted and Duration: 1 min   PATIENT EDUCATION: Education details: Issued HEP Person educated: Patient Education method: Explanation, Demonstration, and Handouts Education comprehension: verbalized understanding  HOME EXERCISE PROGRAM: Access Code: [email or web address] URL: [email or web address] Date: 03/09/2023 Prepared by: Clydie Braun Menke  Exercises - Pencil Pushups  - 1 x daily - 7 x weekly - 2 sets - 10 reps - Seated Gaze Stabilization with  Head Rotation  - 1 x daily - 7 x weekly - 1 sets - 20 reps - Seated Gaze Stabilization with Head Nod  - 1 x daily - 7 x weekly - 1 sets - 20 reps - Brandt-Daroff Vestibular Exercise  - 1 x daily - 7 x weekly - 1 sets - 3 reps - Tandem Walking with Counter Support  - 1 x daily - 7 x weekly - 2 sets - 10 reps - Single Leg Stance with Support  - 1 x daily - 7 x weekly - 1 sets - 2 reps - 20 sec hold - Sit to Stand with Arms Crossed  - 1 x daily - 7 x weekly - 2 sets - 10 reps  GOALS: Goals reviewed with patient? Yes  SHORT TERM GOALS: Target date: 03/31/2023  Patient will be independent with initial HEP. Baseline: Goal status: MET on 03/21/2023  2.  Patient will report at least a 25% improvement in dizziness symptoms. Baseline:  Goal status: MET   LONG TERM GOALS:  Target date: 112/16/2024  Patient will be independent with advanced HEP to allow for self progression after discharge. Baseline:  Goal status: IN PROGRESS 04/24/23  2.  Patient will report at least a 75% improvement in dizziness to allow him to perform functional tasks without increased dizziness. Baseline: Reports 30% improvement on 04/13/23 Goal status: IN PROGRESS  3.  Patient will increase bilat hip strength to at least 4+/5 to allow him to perform functional tasks with improve ease. Baseline: 4/5 Goal status: IN PROGRESS 04/24/23  4.  Patient will report pain of no increase in back pain with various typical household tasks and with community ambulation. Baseline:  Goal status: IN PROGRESS 04/24/23  5.  Patient will report no falls or loss in balance within 2 weeks leading up to discharge. Baseline:  Goal status: IN PROGRESS 04/24/23   ASSESSMENT:  CLINICAL IMPRESSION: Patient arrived to session with report of mildly improved dizziness. Positional testing revealed remaining L posterior canalithiasis. Upon treating this with L epley, patient demonstrated nystagmus indicative of return of R posteirore canalithiasis. BPPV repositioned to horizontal canal, thus treated with apogeotropic Gufoni for R ear, which was successful. Upon retest, patient still with R posterior BPPV remaining at end of session. Patient tolerated session well.   OBJECTIVE IMPAIRMENTS: Abnormal gait, decreased balance, decreased strength, increased muscle spasms, postural dysfunction, and pain.   ACTIVITY LIMITATIONS: carrying, lifting, bending, stairs, and bed mobility  PARTICIPATION LIMITATIONS: driving and community activity  PERSONAL FACTORS: Past/current experiences, Time since onset of injury/illness/exacerbation, and 3 comorbidities: HTN, cataract surgery, Hx of back pain are also affecting patient's functional outcome.   REHAB POTENTIAL: Good  CLINICAL DECISION MAKING: Evolving/moderate  complexity  EVALUATION COMPLEXITY: Moderate   PLAN:  PT FREQUENCY: 2x/week  PT DURATION: 8 weeks  PLANNED INTERVENTIONS: Therapeutic exercises, Therapeutic activity, Neuromuscular re-education, Balance training, Gait training, Patient/Family education, Self Care, Joint mobilization, Joint manipulation, Stair training, Vestibular training, Canalith repositioning, Aquatic Therapy, Dry Needling, Electrical stimulation, Spinal manipulation, Spinal mobilization, Cryotherapy, Moist heat, Taping, Traction, Ultrasound, Manual therapy, and Re-evaluation  PLAN FOR NEXT SESSION: reassess positional testing- if unsuccessful, try vibration, habituation, or other maneuvers?    Anette Guarneri, PT, DPT 05/02/23 4:08 PM  [facility] at [facility] [address] [place], [address] [place], [place] [address] Phone # [phone] Fax # [phone]

## 2023-05-08 ENCOUNTER — Ambulatory Visit: Payer: Medicare HMO | Admitting: Physical Therapy

## 2023-05-08 DIAGNOSIS — B342 Coronavirus infection, unspecified: Secondary | ICD-10-CM | POA: Diagnosis not present

## 2023-05-09 NOTE — Therapy (Incomplete)
OUTPATIENT PHYSICAL THERAPY VESTIBULAR NOTE     Patient Name: Kent Watts MRN: [id number] DOB:19-Mar-1951, 72 y.o., male Today's Date: 05/09/2023      END OF SESSION:       Past Medical History:  Diagnosis Date   Hypertension    Past Surgical History:  Procedure Laterality Date   KNEE SURGERY Right    There are no problems to display for this patient.    REFERRING PROVIDER: Odis Luster, PA-C  REFERRING DIAG: R29.6 (ICD-10-CM) - Frequent falls  THERAPY DIAG:  No diagnosis found.  ONSET DATE: 5 weeks ago  Rationale for Evaluation and Treatment: Rehabilitation  SUBJECTIVE:   SUBJECTIVE STATEMENT: Reports "it's not any worse, it might be a little better."    Pt accompanied by: self  PERTINENT HISTORY: HTN, cataract surgery  PAIN:  Are you having pain? Yes: NPRS scale: 0/10 Pain location: right shoulder Pain description: aching and throbbing Aggravating factors: certain positions and worse first thing in the AM Relieving factors: unknown  PRECAUTIONS: Fall  RED FLAGS: None   WEIGHT BEARING RESTRICTIONS: No  FALLS: Has patient fallen in last 6 months? Yes. 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  Is trying not to do too much moving of items, and Leisure: spending time with granddaughter  PATIENT GOALS: To improve balance and decrease dizziness.  OBJECTIVE:    TODAY'S TREATMENT: 05/10/23 Activity Comments                       HOME EXERCISE PROGRAM Access Code: [email or web address] URL: [email or web address] Date: 05/01/2023 Prepared by: [facility] - Outpatient   Rehab - [facility]  Exercises - Brandt-Daroff Vestibular Exercise  - 1 x daily - 5 x weekly - 2 sets - 3-5 reps - Seated Gaze Stabilization with Head Rotation  - 1 x daily - 5 x weekly - 2-3 sets - 30 sec hold - Seated Nose to Right Knee Vestibular Habituation  - 1 x daily - 5 x weekly - 2 sets - 3-5 reps - 10 sec hold - Seated Nose to Left Knee Vestibular Habituation  - 1 x daily - 5 x weekly - 2 sets - 3-5 reps - 10 sec hold      04/24/23: VESTIBULAR ASSESSMENT   GENERAL OBSERVATION: pt wears readers (hx of cataract surgery)   OCULOMOTOR EXAM:   Ocular Alignment: normal   Ocular ROM: No Limitations   Spontaneous Nystagmus: absent   Gaze-Induced Nystagmus: absent   Smooth Pursuits: intact   Saccades: intact   Convergence/Divergence: c/o diplopia at 1 foot away   VESTIBULAR - OCULAR REFLEX:    Slow VOR: Normal c/o " a little swirly"; slow    VOR Cancellation: Normal   Head-Impulse Test: HIT Right: covert positive HIT Left: negative    POSITIONAL TESTING:  Right Roll Test: negative  Left Roll Test: negative  Right Sidelying: R upbeating torsional lasting ~25 sec; dizziness upon sitting up with reversal of nystagmus  Left Sidelying: L upbeating torsional lasting ~15 sec; dizziness upon sitting up  Right Dix-Hallpike: upbeating nystagmus lasting ~10 sec followed by downbeat nystagmus lasting ~15 sec  Note: Objective measures were completed at Evaluation unless otherwise noted.  DIAGNOSTIC FINDINGS:  Lumbar Radiograph on 03/30/2022: IMPRESSION: 1. No acute fracture. 2. 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 3. Nonobstructive right renal calculus.  Cervical CT Scan on 03/29/2022: IMPRESSION: CT of the head: Small focus of hemorrhage along the falx just to the left of the midline CT of the cervical spine: Multilevel degenerative change without acute abnormality.  COGNITION: Overall cognitive status: Within functional limits for tasks  assessed   SENSATION: Reports some numbness/tingling in bilat feet, especially at night  POSTURE:  rounded shoulders and forward head  Cervical ROM:    Eval:  limited 25% with pain and dizziness when looking up  STRENGTH:  Eval: Bilat UE strength is WFL Bilat quad strength is WFL Bilateral hip strength is 4/5 grossly throughout   FUNCTIONAL TESTS:  Eval:   5 times sit to stand: 16.43 sec MCTSIB: Condition 1: Avg of 3 trials: 30 sec, Condition 2: Avg of 3 trials: 30 sec, Condition 3: Avg of 3 trials: 30 sec, Condition 4: Avg of 3 trials: 30 sec, and Total Score: 120/120 Single Leg Stance:  Right- 7 sec, Left 11 sec  04/04/2023: 5 times sit to stand: 19.43 sec Single Leg Stance:  Right- 15.89 sec, Left 12.57 sec   VESTIBULAR ASSESSMENT:  SYMPTOM BEHAVIOR:  Non-Vestibular symptoms: neck pain  Type of dizziness: Spinning/Vertigo, Unsteady with head/body turns, and "World moves"  Frequency: can happen daily, some days worse than others  Duration: varies  Aggravating factors: Induced by position change: lying supine and rolling to the right and Induced by motion: looking up at the ceiling and turning head quickly  Relieving factors: head stationary  Progression of symptoms: worse  OCULOMOTOR EXAM:  Ocular Alignment: normal  Ocular ROM: No Limitations  Spontaneous Nystagmus: absent  Gaze-Induced Nystagmus: absent  Smooth Pursuits: intact  Saccades: slow    VESTIBULAR - OCULAR REFLEX:   Head Thrust positive for nystagmus   POSITIONAL TESTING: Right Dix-Hallpike: nystagmus noted and Duration: 1 min   PATIENT EDUCATION: Education details: Issued HEP Person educated: Patient Education method: Explanation, Demonstration, and Handouts Education comprehension: verbalized understanding  HOME EXERCISE PROGRAM: Access Code: [email or web address] URL: [email or web address] Date: 03/09/2023 Prepared by: Cly[facility]ke  Exercises - Pencil Pushups  - 1 x daily - 7 x  weekly - 2 sets - 10 reps - Seated Gaze Stabilization with Head Rotation  - 1 x daily - 7 x weekly - 1 sets - 20 reps - Seated Gaze Stabilization with Head Nod  - 1 x daily - 7 x weekly - 1 sets - 20 reps - Brandt-Daroff Vestibular Exercise  - 1 x daily - 7 x weekly - 1 sets - 3 reps - Tandem Walking with Counter Support  - 1 x daily - 7 x weekly - 2 sets - 10 reps - Single Leg Stance with Support  - 1 x daily - 7 x weekly - 1 sets - 2 reps - 20 sec hold - Sit to Stand with Arms Crossed  - 1 x daily - 7 x weekly - 2 sets - 10 reps  GOALS: Goals reviewed with patient? Yes  SHORT TERM GOALS: Target date: 03/31/2023  Patient will be independent with initial HEP. Baseline: Goal status: MET on 03/21/2023  2.  Patient will report at least a 25% improvement in dizziness symptoms. Baseline:  Goal status: MET   LONG TERM GOALS: Target date: 112/16/2024  Patient will be independent with advanced  HEP to allow for self progression after discharge. Baseline:  Goal status: IN PROGRESS 04/24/23  2.  Patient will report at least a 75% improvement in dizziness to allow him to perform functional tasks without increased dizziness. Baseline: Reports 30% improvement on 04/13/23 Goal status: IN PROGRESS  3.  Patient will increase bilat hip strength to at least 4+/5 to allow him to perform functional tasks with improve ease. Baseline: 4/5 Goal status: IN PROGRESS 04/24/23  4.  Patient will report pain of no increase in back pain with various typical household tasks and with community ambulation. Baseline:  Goal status: IN PROGRESS 04/24/23  5.  Patient will report no falls or loss in balance within 2 weeks leading up to discharge. Baseline:  Goal status: IN PROGRESS 04/24/23   ASSESSMENT:  CLINICAL IMPRESSION: Patient arrived to session with report of mildly improved dizziness. Positional testing revealed remaining L posterior canalithiasis. Upon treating this with L epley, patient  demonstrated nystagmus indicative of return of R posteirore canalithiasis. BPPV repositioned to horizontal canal, thus treated with apogeotropic Gufoni for R ear, which was successful. Upon retest, patient still with R posterior BPPV remaining at end of session. Patient tolerated session well.   OBJECTIVE IMPAIRMENTS: Abnormal gait, decreased balance, decreased strength, increased muscle spasms, postural dysfunction, and pain.   ACTIVITY LIMITATIONS: carrying, lifting, bending, stairs, and bed mobility  PARTICIPATION LIMITATIONS: driving and community activity  PERSONAL FACTORS: Past/current experiences, Time since onset of injury/illness/exacerbation, and 3 comorbidities: HTN, cataract surgery, Hx of back pain are also affecting patient's functional outcome.   REHAB POTENTIAL: Good  CLINICAL DECISION MAKING: Evolving/moderate complexity  EVALUATION COMPLEXITY: Moderate   PLAN:  PT FREQUENCY: 2x/week  PT DURATION: 8 weeks  PLANNED INTERVENTIONS: Therapeutic exercises, Therapeutic activity, Neuromuscular re-education, Balance training, Gait training, Patient/Family education, Self Care, Joint mobilization, Joint manipulation, Stair training, Vestibular training, Canalith repositioning, Aquatic Therapy, Dry Needling, Electrical stimulation, Spinal manipulation, Spinal mobilization, Cryotherapy, Moist heat, Taping, Traction, Ultrasound, Manual therapy, and Re-evaluation  PLAN FOR NEXT SESSION: reassess positional testing- if unsuccessful, try vibration, habituation, or other maneuvers?    Anette Guarneri, PT, DPT 05/09/23 7:56 AM  [facility] [address] [place], [address] [place], [place] [address] Phone # [phone] Fax # [phone]

## 2023-05-10 ENCOUNTER — Ambulatory Visit: Payer: Medicare HMO | Admitting: Physical Therapy

## 2023-05-11 NOTE — Therapy (Signed)
OUTPATIENT PHYSICAL THERAPY VESTIBULAR NOTE     Patient Name: Kent Watts MRN: [id number] DOB:17-Sep-1950, 72 y.o., male Today's Date: 05/15/2023      END OF SESSION:  PT End of Session - 05/15/23 1518     Visit Number 14    Number of Visits 19    Date for PT Re-Evaluation 05/22/23    Authorization Type [employer]    Progress Note Due on Visit 20    PT Start Time 1451    PT Stop Time 1516    PT Time Calculation (min) 25 min    Activity Tolerance Patient tolerated treatment well;Other (comment)   dizziness   Behavior During Therapy WFL for tasks assessed/performed                 Past Medical History:  Diagnosis Date   Hypertension    Past Surgical History:  Procedure Laterality Date   KNEE SURGERY Right    There are no problems to display for this patient.    REFERRING PROVIDER: Odis Luster, PA-C  REFERRING DIAG: R29.6 (ICD-10-CM) - Frequent falls  THERAPY DIAG:  Dizziness and giddiness  Abnormal posture  Other abnormalities of gait and mobility  Muscle weakness (generalized)  Other low back pain  ONSET DATE: 5 weeks ago  Rationale for Evaluation and Treatment: Rehabilitation  SUBJECTIVE:   SUBJECTIVE STATEMENT: Was sick- feeling better now. Dizziness is most prominent when looking up at extreme angles with his head. Dizziness "is a tad better but I don't know."   Pt accompanied by: self  PERTINENT HISTORY: HTN, cataract surgery  PAIN:  Are you having pain? Yes: NPRS scale: 0/10 Pain location: right shoulder Pain description: aching and throbbing Aggravating factors: certain positions and worse first thing in the AM Relieving factors: unknown  PRECAUTIONS: Fall  RED FLAGS: None   WEIGHT BEARING RESTRICTIONS: No  FALLS: Has patient fallen in last 6 months? Yes. 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  Is trying not to do too much moving of items, and Leisure: spending time with granddaughter  PATIENT GOALS: To improve balance and decrease dizziness.  OBJECTIVE:    TODAY'S TREATMENT: 05/15/23 Activity Comments  R sidelying test A few beats R upbeating torsional  L sidelying test L upbeating torsional lasting ~15 sec  R roll test negative  L roll test negative  L universal repositioning maneuver  Tolerated well   L sidelying Negative, however pt reports spinning upon sitting up which lasted several minutes and required sitting break  Vitals  114/71 mmHg, 65 bpm, 97% spO2            HOME EXERCISE PROGRAM Access Code: [email or web address] URL: [email or web address] Date: 05/01/2023 Prepared by: [facility] - Outpatient  Rehab - [facility]  Exercises - Brandt-Daroff Vestibular Exercise  - 1 x daily - 5 x weekly - 2 sets - 3-5 reps - Seated Gaze Stabilization with Head Rotation  - 1 x daily - 5 x weekly - 2-3 sets - 30 sec hold - Seated Nose to Right Knee Vestibular Habituation  - 1 x daily - 5 x weekly - 2 sets -  3-5 reps - 10 sec hold - Seated Nose to Left Knee Vestibular Habituation  - 1 x daily - 5 x weekly - 2 sets - 3-5 reps - 10 sec hold      04/24/23: VESTIBULAR ASSESSMENT   GENERAL OBSERVATION: pt wears readers (hx of cataract surgery)   OCULOMOTOR EXAM:   Ocular Alignment: normal   Ocular ROM: No Limitations   Spontaneous Nystagmus: absent   Gaze-Induced Nystagmus: absent   Smooth Pursuits: intact   Saccades: intact   Convergence/Divergence: c/o diplopia at 1 foot away   VESTIBULAR - OCULAR REFLEX:    Slow VOR: Normal c/o " a little swirly"; slow    VOR Cancellation: Normal   Head-Impulse Test:  HIT Right: covert positive HIT Left: negative    POSITIONAL TESTING:  Right Roll Test: negative  Left Roll Test: negative  Right Sidelying: R upbeating torsional lasting ~25 sec; dizziness upon sitting up with reversal of nystagmus  Left Sidelying: L upbeating torsional lasting ~15 sec; dizziness upon sitting up  Right Dix-Hallpike: upbeating nystagmus lasting ~10 sec followed by downbeat nystagmus lasting ~15 sec    GOALS: Goals reviewed with patient? Yes  SHORT TERM GOALS: Target date: 03/31/2023  Patient will be independent with initial HEP. Baseline: Goal status: MET on 03/21/2023  2.  Patient will report at least a 25% improvement in dizziness symptoms. Baseline:  Goal status: MET   LONG TERM GOALS: Target date: 112/16/2024  Patient will be independent with advanced HEP to allow for self progression after discharge. Baseline:  Goal status: IN PROGRESS 04/24/23  2.  Patient will report at least a 75% improvement in dizziness to allow him to perform functional tasks without increased dizziness. Baseline: Reports 30% improvement on 04/13/23 Goal status: IN PROGRESS  3.  Patient will increase bilat hip strength to at least 4+/5 to allow him to perform functional tasks with improve ease. Baseline: 4/5 Goal status: IN PROGRESS 04/24/23  4.  Patient will report pain of no increase in back pain with various typical household tasks and with community ambulation. Baseline:  Goal status: IN PROGRESS 04/24/23  5.  Patient will report no falls or loss in balance within 2 weeks leading up to discharge. Baseline:  Goal status: IN PROGRESS 04/24/23   ASSESSMENT:  CLINICAL IMPRESSION: Patient arrived to session with report of dizziness worst when "looking up at extreme angles" with his head. Positional testing revealed B posterior canalithiasis. Treated with L universal repositioning maneuver, which appeared successful however patient reported sensation of spinning upon  sitting up and required sit break. Vitals normal. Patient requested to end session early d/t not feeling well. Stated he would sit in the car before driving.    OBJECTIVE IMPAIRMENTS: Abnormal gait, decreased balance, decreased strength, increased muscle spasms, postural dysfunction, and pain.   ACTIVITY LIMITATIONS: carrying, lifting, bending, stairs, and bed mobility  PARTICIPATION LIMITATIONS: driving and community activity  PERSONAL FACTORS: Past/current experiences, Time since onset of injury/illness/exacerbation, and 3 comorbidities: HTN, cataract surgery, Hx of back pain are also affecting patient's functional outcome.   REHAB POTENTIAL: Good  CLINICAL DECISION MAKING: Evolving/moderate complexity  EVALUATION COMPLEXITY: Moderate   PLAN:  PT FREQUENCY: 2x/week  PT DURATION: 8 weeks  PLANNED INTERVENTIONS: Therapeutic exercises, Therapeutic activity, Neuromuscular re-education, Balance training, Gait training, Patient/Family education, Self Care, Joint mobilization, Joint manipulation, Stair training, Vestibular training, Canalith repositioning, Aquatic Therapy, Dry Needling, Electrical stimulation, Spinal manipulation, Spinal mobilization, Cryotherapy, Moist heat, Taping, Traction, Ultrasound, Manual therapy, and Re-evaluation  PLAN  FOR NEXT SESSION: reassessment; reassess positional testing- if unsuccessful, try vibration, habituation, or other maneuvers?    Baldemar Friday, PT, DPT 05/15/23 3:20 PM   Outpatient Rehab at [facility] [address] [place], [address] [place], [place] [address] Phone # [phone] Fax # [phone]

## 2023-05-15 ENCOUNTER — Encounter: Payer: Self-pay | Admitting: Physical Therapy

## 2023-05-15 ENCOUNTER — Ambulatory Visit: Payer: Medicare HMO | Attending: Family Medicine | Admitting: Physical Therapy

## 2023-05-15 DIAGNOSIS — M5459 Other low back pain: Secondary | ICD-10-CM

## 2023-05-15 DIAGNOSIS — R42 Dizziness and giddiness: Secondary | ICD-10-CM

## 2023-05-15 DIAGNOSIS — R2689 Other abnormalities of gait and mobility: Secondary | ICD-10-CM | POA: Diagnosis not present

## 2023-05-15 DIAGNOSIS — R293 Abnormal posture: Secondary | ICD-10-CM

## 2023-05-15 DIAGNOSIS — M6281 Muscle weakness (generalized): Secondary | ICD-10-CM | POA: Diagnosis not present

## 2023-05-16 NOTE — Therapy (Signed)
OUTPATIENT PHYSICAL THERAPY VESTIBULAR PROGRESS NOTE/RE-CERT     Patient Name: Kent Watts MRN: [id number] DOB:1950-12-27, 72 y.o., male Today's Date: 05/17/2023    Progress Note Reporting Period 04/24/23 to 05/17/23  See note below for Objective Data and Assessment of Progress/Goals.     END OF SESSION:  PT End of Session - 05/17/23 1602     Visit Number 15    Number of Visits 19    Date for PT Re-Evaluation 06/14/23    Authorization Type [employer]    Progress Note Due on Visit 20    PT Start Time 1535    PT Stop Time 1602    PT Time Calculation (min) 27 min    Activity Tolerance Patient tolerated treatment well    Behavior During Therapy WFL for tasks assessed/performed                  Past Medical History:  Diagnosis Date   Hypertension    Past Surgical History:  Procedure Laterality Date   KNEE SURGERY Right    There are no problems to display for this patient.    REFERRING PROVIDER: Odis Luster, PA-C  REFERRING DIAG: R29.6 (ICD-10-CM) - Frequent falls  THERAPY DIAG:  Dizziness and giddiness  Abnormal posture  Other abnormalities of gait and mobility  Muscle weakness (generalized)  Other low back pain  ONSET DATE: 5 weeks ago  Rationale for Evaluation and Treatment: Rehabilitation  SUBJECTIVE:   SUBJECTIVE STATEMENT: Reports that he sat out in the car for 10 minutes. While driving he got intense spinning while tilting his head back a bit and pulled over into a parking lot for about 15 minutes. Reports symptoms are worst with changes with position. Reports that he has not performed his HEP in a week. Reports that dizziness is not as bad as it first started (reports 50%).  Reports LBP is not limiting him with ADLs any longer.    Pt accompanied by: self, wife   PERTINENT HISTORY: HTN, cataract surgery  PAIN:  Are you having pain? Yes: NPRS scale: 0/10 Pain location: right shoulder Pain description: aching and  throbbing Aggravating factors: certain positions and worse first thing in the AM Relieving factors: unknown  PRECAUTIONS: Fall  RED FLAGS: None   WEIGHT BEARING RESTRICTIONS: No  FALLS: Has patient fallen in last 6 months? Yes. 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  Is trying not to do too much moving of items, and Leisure: spending time with granddaughter  PATIENT GOALS: To improve balance and decrease dizziness.  OBJECTIVE:     TODAY'S TREATMENT: 05/17/23 Activity Comments  L sidelying test L upbeating torsional nystagmus lasting ~15 sec  R sidelying test R upbeating torsional nystagmus lasting ~ 7 sec  L DH  L upbeating torsional nystagmus lasting ~15 sec  L Epley  Tolerated well ; R upbeating torsional nystagmus lasting ~10 sec in position 2  Sitting hip MMT Flexion  Abduction Adduction   4+/5 B L 4/5, R 4+/5 L 4/5, R 4+/5  L DH Negative      PATIENT EDUCATION: Education details: edu on progress towards goals  and remaining impairments; edu on importance of maintaining consistency with HEP and benefits of general physical activity on vestibular system, POC Person educated: Patient and Spouse Education method: Explanation Education comprehension: verbalized understanding    HOME EXERCISE PROGRAM Access Code: [email or web address] URL: [email or web address] Date: 05/01/2023 Prepared by: [facility] - Outpatient  Rehab - [facility]  Exercises - Brandt-Daroff Vestibular Exercise  - 1 x daily - 5 x weekly - 2 sets - 3-5 reps - Seated Gaze Stabilization with Head Rotation  - 1 x daily - 5  x weekly - 2-3 sets - 30 sec hold - Seated Nose to Right Knee Vestibular Habituation  - 1 x daily - 5 x weekly - 2 sets - 3-5 reps - 10 sec hold - Seated Nose to Left Knee Vestibular Habituation  - 1 x daily - 5 x weekly - 2 sets - 3-5 reps - 10 sec hold      04/24/23: VESTIBULAR ASSESSMENT   GENERAL OBSERVATION: pt wears readers (hx of cataract surgery)   OCULOMOTOR EXAM:   Ocular Alignment: normal   Ocular ROM: No Limitations   Spontaneous Nystagmus: absent   Gaze-Induced Nystagmus: absent   Smooth Pursuits: intact   Saccades: intact   Convergence/Divergence: c/o diplopia at 1 foot away   VESTIBULAR - OCULAR REFLEX:    Slow VOR: Normal c/o " a little swirly"; slow    VOR Cancellation: Normal   Head-Impulse Test: HIT Right: covert positive HIT Left: negative    POSITIONAL TESTING:  Right Roll Test: negative  Left Roll Test: negative  Right Sidelying: R upbeating torsional lasting ~25 sec; dizziness upon sitting up with reversal of nystagmus  Left Sidelying: L upbeating torsional lasting ~15 sec; dizziness upon sitting up  Right Dix-Hallpike: upbeating nystagmus lasting ~10 sec followed by downbeat nystagmus lasting ~15 sec    GOALS: Goals reviewed with patient? Yes  SHORT TERM GOALS: Target date: 03/31/2023  Patient will be independent with initial HEP. Baseline: Goal status: MET on 03/21/2023  2.  Patient will report at least a 25% improvement in dizziness symptoms. Baseline:  Goal status: MET   LONG TERM GOALS: Target date: 06/14/2023  Patient will be independent with advanced HEP to allow for self progression after discharge. Baseline:  Goal status: IN PROGRESS 05/17/23  2.  Patient will report at least a 75% improvement in dizziness to allow him to perform functional tasks without increased dizziness. Baseline: Reports 30% improvement on 04/13/23; reports 50% 05/17/23 Goal status: IN PROGRESS 05/17/23  3.  Patient will increase bilat hip strength to at  least 4+/5 to allow him to perform functional tasks with improve ease. Baseline: 4/5; improved but not met 05/17/23 Goal status: IN PROGRESS 05/17/23  4.  Patient will report pain of no increase in back pain with various typical household tasks and with community ambulation. Baseline: Reports LBP is not limiting him with ADLs any longer.  05/17/23 Goal status: MET 05/17/23  5.  Patient will report no falls or loss in balance within 2 weeks leading up to discharge. Baseline: denies falls in the past 2 weeks 05/17/23 Goal status: IN PROGRESS 05/17/23   ASSESSMENT:  CLINICAL IMPRESSION: Patient arrived to session with report of an episode of dizziness when tilting head back while driving after last appointment. Wife drove him to today's session for safety. Patient reports 50% dizziness since initial evaluation. Strength testing revealed improvement in B hip flexion and R hip abduction/adduction. Patient reports no increase in LBP with ADLs at this  time and reports no falls in the past 2 weeks. Positional testing grossly unchanged since last session; L posterior canal most affected. Proceeded with L Epley x1 with resolution of symptoms upon retest. Reported feeling well upon leaving. Patient is responding to CRM despite reoccurring nature of BPPV. Would benefit from additional skilled PT services 1x/week for 4 weeks to address remaining goals.   OBJECTIVE IMPAIRMENTS: Abnormal gait, decreased balance, decreased strength, increased muscle spasms, postural dysfunction, and pain.   ACTIVITY LIMITATIONS: carrying, lifting, bending, stairs, and bed mobility  PARTICIPATION LIMITATIONS: driving and community activity  PERSONAL FACTORS: Past/current experiences, Time since onset of injury/illness/exacerbation, and 3 comorbidities: HTN, cataract surgery, Hx of back pain are also affecting patient's functional outcome.   REHAB POTENTIAL: Good  CLINICAL DECISION MAKING: Evolving/moderate  complexity  EVALUATION COMPLEXITY: Moderate   PLAN:  PT FREQUENCY: 1x/week  PT DURATION: 4 weeks  PLANNED INTERVENTIONS: Therapeutic exercises, Therapeutic activity, Neuromuscular re-education, Balance training, Gait training, Patient/Family education, Self Care, Joint mobilization, Joint manipulation, Stair training, Vestibular training, Canalith repositioning, Aquatic Therapy, Dry Needling, Electrical stimulation, Spinal manipulation, Spinal mobilization, Cryotherapy, Moist heat, Taping, Traction, Ultrasound, Manual therapy, and Re-evaluation  PLAN FOR NEXT SESSION: reassess positional testing- if unsuccessful, try vibration, habituation, or other maneuvers?    Baldemar Friday, PT, DPT 05/17/23 4:10 PM  [facility] at [facility] [address] [place], [address] [place], [place] [address] Phone # [phone] Fax # [phone]

## 2023-05-17 ENCOUNTER — Ambulatory Visit: Payer: Medicare HMO | Admitting: Physical Therapy

## 2023-05-17 ENCOUNTER — Encounter: Payer: Self-pay | Admitting: Physical Therapy

## 2023-05-17 DIAGNOSIS — R293 Abnormal posture: Secondary | ICD-10-CM

## 2023-05-17 DIAGNOSIS — M6281 Muscle weakness (generalized): Secondary | ICD-10-CM

## 2023-05-17 DIAGNOSIS — M5459 Other low back pain: Secondary | ICD-10-CM | POA: Diagnosis not present

## 2023-05-17 DIAGNOSIS — R2689 Other abnormalities of gait and mobility: Secondary | ICD-10-CM | POA: Diagnosis not present

## 2023-05-17 DIAGNOSIS — R42 Dizziness and giddiness: Secondary | ICD-10-CM

## 2023-05-19 NOTE — Therapy (Signed)
OUTPATIENT PHYSICAL THERAPY VESTIBULAR NOTE     Patient Name: Kent Watts MRN: [id number] DOB:[birth year], 72 y.o., male Today's Date: 05/22/2023     END OF SESSION:  PT End of Session - 05/22/23 1559     Visit Number 16    Number of Visits 19    Date for PT Re-Evaluation 06/14/23    Authorization Type [employer]    Progress Note Due on Visit 20    PT Start Time 1530    PT Stop Time 1608    PT Time Calculation (min) 38 min    Activity Tolerance Patient tolerated treatment well    Behavior During Therapy WFL for tasks assessed/performed                   Past Medical History:  Diagnosis Date   Hypertension    Past Surgical History:  Procedure Laterality Date   KNEE SURGERY Right    There are no active problems to display for this patient.    REFERRING PROVIDER: Odis Luster, PA-C  REFERRING DIAG: R29.6 (ICD-10-CM) - Frequent falls  THERAPY DIAG:  Dizziness and giddiness  Abnormal posture  Other abnormalities of gait and mobility  Muscle weakness (generalized)  Other low back pain  ONSET DATE: 5 weeks ago  Rationale for Evaluation and Treatment: Rehabilitation  SUBJECTIVE:   SUBJECTIVE STATEMENT: Reports no change in symptoms.    Pt accompanied by: self  PERTINENT HISTORY: HTN, cataract surgery  PAIN:  Are you having pain? Yes: NPRS scale: 0/10 Pain location: right shoulder Pain description: aching and throbbing Aggravating factors: certain positions and worse first thing in the AM Relieving factors: unknown  PRECAUTIONS: Fall  RED FLAGS: None   WEIGHT BEARING RESTRICTIONS: No  FALLS: Has patient fallen in last 6 months? Yes. 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  Is trying not to do too much moving of items, and Leisure: spending time with granddaughter  PATIENT GOALS: To improve balance and decrease dizziness.  OBJECTIVE:     TODAY'S TREATMENT: 05/22/23 Activity Comments  L DH L upbeating torsional nystagmus lasting ~15 sec  R DH R upbeating torsional nystagmus lasting ~10 sec  Universal repositioning maneuver L Tolerated well  L DH Negative , but reported dizziness and nausea upon sitting up.           PATIENT EDUCATION: Education details: discussion on pt's reoccurring BPPV and treatment options including learning CRM at home and possible surgeries explained to patient, these are rarely done; provided handout and edu on universal repositioning maneuver ; encouraged use of shower seat for safety and trying recumbent bike for exercise  Person educated: Patient Education method: Explanation, Demonstration, Tactile cues, Verbal cues, and Handouts Education comprehension: verbalized understanding and returned demonstration     HOME EXERCISE PROGRAM Access Code: [email or web address] URL: [email or web address] Date: 05/01/2023 Prepared by: [facility] - Outpatient  Rehab - [facility]  Exercises - Brandt-Daroff Vestibular Exercise  - 1 x daily - 5 x weekly - 2 sets - 3-5 reps - Seated Gaze Stabilization with Head Rotation  - 1 x daily - 5 x weekly - 2-3  sets - 30 sec hold - Seated Nose to Right Knee Vestibular Habituation  - 1 x daily - 5 x weekly - 2 sets - 3-5 reps - 10 sec hold - Seated Nose to Left Knee Vestibular Habituation  - 1 x daily - 5 x weekly - 2 sets - 3-5 reps - 10 sec hold      04/24/23: VESTIBULAR ASSESSMENT   GENERAL OBSERVATION: pt wears readers (hx of cataract surgery)   OCULOMOTOR EXAM:   Ocular Alignment: normal   Ocular ROM: No Limitations   Spontaneous Nystagmus:  absent   Gaze-Induced Nystagmus: absent   Smooth Pursuits: intact   Saccades: intact   Convergence/Divergence: c/o diplopia at 1 foot away   VESTIBULAR - OCULAR REFLEX:    Slow VOR: Normal c/o " a little swirly"; slow    VOR Cancellation: Normal   Head-Impulse Test: HIT Right: covert positive HIT Left: negative    POSITIONAL TESTING:  Right Roll Test: negative  Left Roll Test: negative  Right Sidelying: R upbeating torsional lasting ~25 sec; dizziness upon sitting up with reversal of nystagmus  Left Sidelying: L upbeating torsional lasting ~15 sec; dizziness upon sitting up  Right Dix-Hallpike: upbeating nystagmus lasting ~10 sec followed by downbeat nystagmus lasting ~15 sec    GOALS: Goals reviewed with patient? Yes  SHORT TERM GOALS: Target date: 03/31/2023  Patient will be independent with initial HEP. Baseline: Goal status: MET on 03/21/2023  2.  Patient will report at least a 25% improvement in dizziness symptoms. Baseline:  Goal status: MET   LONG TERM GOALS: Target date: 06/14/2023  Patient will be independent with advanced HEP to allow for self progression after discharge. Baseline:  Goal status: IN PROGRESS 05/17/23  2.  Patient will report at least a 75% improvement in dizziness to allow him to perform functional tasks without increased dizziness. Baseline: Reports 30% improvement on 04/13/23; reports 50% 05/17/23 Goal status: IN PROGRESS 05/17/23  3.  Patient will increase bilat hip strength to at least 4+/5 to allow him to perform functional tasks with improve ease. Baseline: 4/5; improved but not met 05/17/23 Goal status: IN PROGRESS 05/17/23  4.  Patient will report pain of no increase in back pain with various typical household tasks and with community ambulation. Baseline: Reports LBP is not limiting him with ADLs any longer.  05/17/23 Goal status: MET 05/17/23  5.  Patient will report no falls or loss in balance within 2 weeks leading up to  discharge. Baseline: denies falls in the past 2 weeks 05/17/23 Goal status: IN PROGRESS 05/17/23   ASSESSMENT:  CLINICAL IMPRESSION: Patient arrived to session with report of no changes. 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 Patient responded well to CRM last session, thus continues to struggle with recurrence of BPPV. Provided education on CRM at home to help manage this condition at home. Patient tolerated L Universal repositioning maneuver with good resolution of L BPPV. However, patient reported dizziness and nausea upon sitting up. Sat in private treatment area while symptoms resolved as he chose to have a family member to drive him home. Reported feeling "a hundred times better" upon standing up to walk to waiting room. Later reported that he took his meds on an empty stomach before session which may have contributed to nausea.   OBJECTIVE IMPAIRMENTS: Abnormal gait, decreased balance, decreased strength, increased muscle spasms, postural dysfunction, and pain.   ACTIVITY LIMITATIONS: carrying, lifting, bending, stairs, and bed mobility  PARTICIPATION LIMITATIONS: driving and community  activity  PERSONAL FACTORS: Past/current experiences, Time since onset of injury/illness/exacerbation, and 3 comorbidities: HTN, cataract surgery, Hx of back pain are also affecting patient's functional outcome.   REHAB POTENTIAL: Good  CLINICAL DECISION MAKING: Evolving/moderate complexity  EVALUATION COMPLEXITY: Moderate   PLAN:  PT FREQUENCY: 1x/week  PT DURATION: 4 weeks  PLANNED INTERVENTIONS: Therapeutic exercises, Therapeutic activity, Neuromuscular re-education, Balance training, Gait training, Patient/Family education, Self Care, Joint mobilization, Joint manipulation, Stair training, Vestibular training, Canalith repositioning, Aquatic Therapy, Dry Needling, Electrical stimulation, Spinal manipulation, Spinal mobilization, Cryotherapy, Moist heat, Taping,  Traction, Ultrasound, Manual therapy, and Re-evaluation  PLAN FOR NEXT SESSION: f/u on CRM maneuver at home    Baldemar Friday, PT, DPT 05/22/23 4:43 PM  [facility] at [facility] [address], [address] [place], [place] [address] Phone # [phone] Fax # [phone]

## 2023-05-22 ENCOUNTER — Ambulatory Visit: Payer: Medicare HMO | Admitting: Physical Therapy

## 2023-05-22 ENCOUNTER — Encounter: Payer: Self-pay | Admitting: Physical Therapy

## 2023-05-22 DIAGNOSIS — R293 Abnormal posture: Secondary | ICD-10-CM | POA: Diagnosis not present

## 2023-05-22 DIAGNOSIS — R2689 Other abnormalities of gait and mobility: Secondary | ICD-10-CM

## 2023-05-22 DIAGNOSIS — M5459 Other low back pain: Secondary | ICD-10-CM | POA: Diagnosis not present

## 2023-05-22 DIAGNOSIS — M6281 Muscle weakness (generalized): Secondary | ICD-10-CM

## 2023-05-22 DIAGNOSIS — R42 Dizziness and giddiness: Secondary | ICD-10-CM

## 2023-05-22 NOTE — Patient Instructions (Signed)
Universal Repositioning Maneuver   For the R ear:

## 2023-05-25 NOTE — Therapy (Signed)
OUTPATIENT PHYSICAL THERAPY VESTIBULAR NOTE     Patient Name: Kent Watts MRN: [id number] DOB:12-Oct-1950, 72 y.o., male Today's Date: 05/29/2023     END OF SESSION:  PT End of Session - 05/29/23 1222     Visit Number 17    Number of Visits 19    Date for PT Re-Evaluation 06/14/23    Authorization Type [employer]    Progress Note Due on Visit 20    PT Start Time 1149    PT Stop Time 1222    PT Time Calculation (min) 33 min    Equipment Utilized During Treatment Gait belt    Activity Tolerance Patient tolerated treatment well    Behavior During Therapy WFL for tasks assessed/performed                    Past Medical History:  Diagnosis Date   Hypertension    Past Surgical History:  Procedure Laterality Date   KNEE SURGERY Right    There are no active problems to display for this patient.    REFERRING PROVIDER: Odis Luster, PA-C  REFERRING DIAG: R29.6 (ICD-10-CM) - Frequent falls  THERAPY DIAG:  Dizziness and giddiness  Abnormal posture  Other abnormalities of gait and mobility  Muscle weakness (generalized)  Other low back pain  ONSET DATE: 5 weeks ago  Rationale for Evaluation and Treatment: Rehabilitation  SUBJECTIVE:   SUBJECTIVE STATEMENT: Took another hour or so after last session to resolve symptoms. Got this " respiratory thing" after that and has not been out or doing his exercises due to that. Otherwise nothing new.    Pt accompanied by: self  PERTINENT HISTORY: HTN, cataract surgery  PAIN:  Are you having pain? Yes: NPRS scale: 0/10 Pain location: right shoulder Pain description: aching and throbbing Aggravating factors: certain positions and worse first thing in the AM Relieving factors: unknown  PRECAUTIONS: Fall  RED FLAGS: None   WEIGHT BEARING RESTRICTIONS: No  FALLS: Has patient fallen in last 6 months? Yes. 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  Is trying not to do too much moving of items, and Leisure: spending time with granddaughter  PATIENT GOALS: To improve balance and decrease dizziness.  OBJECTIVE:     TODAY'S TREATMENT: 05/29/23 Activity Comments  L sidelying test L upbeating torsional nystagmus lasting ~30 sec   R sidelying test R upbeating torsional nystagmus lasting ~10 sec; pt reports less intense   L universal repositioning maneuver  Dizziness in all positions but tolerated well  L sidelying test  Latent and small amplitude nystagmus difficult to discern; less symptomatic but still dizzy upon sitting up   L universal repositioning maneuver  Improved tolerance in all 3 positions   Standing head nods 2x30" Reports felt "okay" only slight dizziness with neck flexion   Standing D2 flexion to cone 5x each side Reports less dizziness than previous exercise CGA d/t mild unsteadiness             PATIENT EDUCATION: Education details: encouraged pt to try universal repositioning maneuver at home; advised to speak to PCP about risk factors of BPPV, plan  to wrap up within coming visits ; HEP update Person educated: Patient Education method: Explanation Education comprehension: verbalized understanding   HOME EXERCISE PROGRAM Access Code: [email or web address] URL: [email or web address] Date: 05/29/2023 Prepared by: [facility] - Outpatient  Rehab - [facility]  Exercises - Brandt-Daroff Vestibular Exercise  - 1 x daily - 5 x weekly - 2 sets - 3-5 reps - Seated Gaze Stabilization with Head Rotation  - 1 x daily - 5 x weekly - 2-3 sets - 30 sec hold - Seated Nose to Right Knee Vestibular  Habituation  - 1 x daily - 5 x weekly - 2 sets - 3-5 reps - 10 sec hold - Seated Nose to Left Knee Vestibular Habituation  - 1 x daily - 5 x weekly - 2 sets - 3-5 reps - 10 sec hold - Standing with Head Nod  - 1 x daily - 5 x weekly - 2-3 sets - 30 sec hold      04/24/23: VESTIBULAR ASSESSMENT   GENERAL OBSERVATION: pt wears readers (hx of cataract surgery)   OCULOMOTOR EXAM:   Ocular Alignment: normal   Ocular ROM: No Limitations   Spontaneous Nystagmus: absent   Gaze-Induced Nystagmus: absent   Smooth Pursuits: intact   Saccades: intact   Convergence/Divergence: c/o diplopia at 1 foot away   VESTIBULAR - OCULAR REFLEX:    Slow VOR: Normal c/o " a little swirly"; slow    VOR Cancellation: Normal   Head-Impulse Test: HIT Right: covert positive HIT Left: negative    POSITIONAL TESTING:  Right Roll Test: negative  Left Roll Test: negative  Right Sidelying: R upbeating torsional lasting ~25 sec; dizziness upon sitting up with reversal of nystagmus  Left Sidelying: L upbeating torsional lasting ~15 sec; dizziness upon sitting up  Right Dix-Hallpike: upbeating nystagmus lasting ~10 sec followed by downbeat nystagmus lasting ~15 sec    GOALS: Goals reviewed with patient? Yes  SHORT TERM GOALS: Target date: 03/31/2023  Patient will be independent with initial HEP. Baseline: Goal status: MET on 03/21/2023  2.  Patient will report at least a 25% improvement in dizziness symptoms. Baseline:  Goal status: MET   LONG TERM GOALS: Target date: 06/14/2023  Patient will be independent with advanced HEP to allow for self progression after discharge. Baseline:  Goal status: IN PROGRESS 05/17/23  2.  Patient will report at least a 75% improvement in dizziness to allow him to perform functional tasks without increased dizziness. Baseline: Reports 30% improvement on 04/13/23; reports 50% 05/17/23 Goal status: IN PROGRESS 05/17/23  3.  Patient will increase bilat hip strength to  at least 4+/5 to allow him to perform functional tasks with improve ease. Baseline: 4/5; improved but not met 05/17/23 Goal status: IN PROGRESS 05/17/23  4.  Patient will report pain of no increase in back pain with various typical household tasks and with community ambulation. Baseline: Reports LBP is not limiting him with ADLs any longer.  05/17/23 Goal status: MET 05/17/23  5.  Patient will report no falls or loss in balance within 2 weeks leading up to discharge. Baseline: denies falls in the past 2 weeks 05/17/23 Goal status: IN PROGRESS 05/17/23   ASSESSMENT:  CLINICAL IMPRESSION: Patient arrived to session with report of reduced activity levels since last session d/t getting sick. 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 Patient reports that he has not tried exercises or repositioning at home. Treated with L Universal Repositioning Maneuver x2 with good tolerance and benefit. Proceeded with standing habituation which  was performed with only minor symptoms. Encouraged increased compliance with HEP for max benefit. Patient reported understanding and reported feeling well upon leaving.   OBJECTIVE IMPAIRMENTS: Abnormal gait, decreased balance, decreased strength, increased muscle spasms, postural dysfunction, and pain.   ACTIVITY LIMITATIONS: carrying, lifting, bending, stairs, and bed mobility  PARTICIPATION LIMITATIONS: driving and community activity  PERSONAL FACTORS: Past/current experiences, Time since onset of injury/illness/exacerbation, and 3 comorbidities: HTN, cataract surgery, Hx of back pain are also affecting patient's functional outcome.   REHAB POTENTIAL: Good  CLINICAL DECISION MAKING: Evolving/moderate complexity  EVALUATION COMPLEXITY: Moderate   PLAN:  PT FREQUENCY: 1x/week  PT DURATION: 4 weeks  PLANNED INTERVENTIONS: Therapeutic exercises, Therapeutic activity, Neuromuscular re-education, Balance training, Gait training, Patient/Family  education, Self Care, Joint mobilization, Joint manipulation, Stair training, Vestibular training, Canalith repositioning, Aquatic Therapy, Dry Needling, Electrical stimulation, Spinal manipulation, Spinal mobilization, Cryotherapy, Moist heat, Taping, Traction, Ultrasound, Manual therapy, and Re-evaluation  PLAN FOR NEXT SESSION: f/u on CRM maneuver at home    Baldemar Friday, PT, DPT 05/29/23 12:26 PM  [facility] at [facility] [address] [place], [place] [address] Phone # [phone] Fax # [phone]

## 2023-05-29 ENCOUNTER — Ambulatory Visit: Payer: Medicare HMO | Admitting: Physical Therapy

## 2023-05-29 ENCOUNTER — Encounter: Payer: Self-pay | Admitting: Physical Therapy

## 2023-05-29 DIAGNOSIS — R293 Abnormal posture: Secondary | ICD-10-CM | POA: Diagnosis not present

## 2023-05-29 DIAGNOSIS — R42 Dizziness and giddiness: Secondary | ICD-10-CM

## 2023-05-29 DIAGNOSIS — R2689 Other abnormalities of gait and mobility: Secondary | ICD-10-CM

## 2023-05-29 DIAGNOSIS — M5459 Other low back pain: Secondary | ICD-10-CM | POA: Diagnosis not present

## 2023-05-29 DIAGNOSIS — M6281 Muscle weakness (generalized): Secondary | ICD-10-CM | POA: Diagnosis not present

## 2023-06-01 DIAGNOSIS — R053 Chronic cough: Secondary | ICD-10-CM | POA: Diagnosis not present

## 2023-06-01 DIAGNOSIS — J209 Acute bronchitis, unspecified: Secondary | ICD-10-CM | POA: Diagnosis not present

## 2023-06-01 DIAGNOSIS — R0989 Other specified symptoms and signs involving the circulatory and respiratory systems: Secondary | ICD-10-CM | POA: Diagnosis not present

## 2023-06-02 NOTE — Therapy (Signed)
 OUTPATIENT PHYSICAL THERAPY VESTIBULAR NOTE     Patient Name: Kent Watts MRN: [id number] DOB:[birth year], 72 y.o., male Today's Date: 06/08/2023     END OF SESSION:  PT End of Session - 06/08/23 1444     Visit Number 18    Number of Visits 19    Date for PT Re-Evaluation 06/14/23    Authorization Type [employer]    Progress Note Due on Visit 20    PT Start Time 1401    PT Stop Time 1443    PT Time Calculation (min) 42 min    Equipment Utilized During Treatment Gait belt    Activity Tolerance Patient tolerated treatment well    Behavior During Therapy WFL for tasks assessed/performed                     Past Medical History:  Diagnosis Date   Hypertension    Past Surgical History:  Procedure Laterality Date   KNEE SURGERY Right    There are no active problems to display for this patient.    REFERRING PROVIDER: Lynwood Laneta ORN, PA-C  REFERRING DIAG: R29.6 (ICD-10-CM) - Frequent falls  THERAPY DIAG:  Dizziness and giddiness  Abnormal posture  Other abnormalities of gait and mobility  Muscle weakness (generalized)  Other low back pain  ONSET DATE: 5 weeks ago  Rationale for Evaluation and Treatment: Rehabilitation  SUBJECTIVE:   SUBJECTIVE STATEMENT: It's generally better. The biggest issue is when I am down and get up. Went to the space station exhibit in [place] and was surprised at how well he tolerated it. Reports that he did the universal repositioning maneuver at home for the L ear- reports sometimes it'll make me dizzy and sometimes it won't. Reports no dizziness with looking overhead/bending    Pt accompanied by: self  PERTINENT HISTORY: HTN, cataract surgery  PAIN:  Are you having pain? Yes: NPRS scale: 0/10 Pain location: right shoulder Pain description: aching and throbbing Aggravating factors: certain positions and worse first thing in the AM Relieving factors: unknown  PRECAUTIONS: Fall  RED  FLAGS: None   WEIGHT BEARING RESTRICTIONS: No  FALLS: Has patient fallen in last 6 months? Yes. 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  Is trying not to do too much moving of items, and Leisure: spending time with granddaughter  PATIENT GOALS: To improve balance and decrease dizziness.  OBJECTIVE:     TODAY'S TREATMENT: 06/08/23 Activity Comments  Review of HEP:   Wilhelmena daroff R/L R: R upbeating torsional for ~5 sec L: L upbeating torsional for ~20 sec  Sitting horizontal VOR 30 No dizziness   Sitting anterior canal habituation  2x each side  Cues to tuck chin slightly and hold at least 10 sec. No dizziness   Standing head nods 30   L universal repositioning maneuver  Tolerated well  L sidelying test  L beating nystagmus   L BBQ roll Geotropic nystagmus in R sidelying   L roll test Apogeotropic nystagmus lasting ~ 1 min   R roll test negative  L roll test Negative   R sidelying test 2 beats L  torsional nystagmus     HOME EXERCISE PROGRAM Access Code: [email or web address] URL: [email or web address] Date: 06/08/2023 Prepared by: [facility] - Outpatient  Rehab - [facility]  Program Notes perform this exercise daily. if dizziness is particularly bad, perform the Universal Repositioning Maneuver provided.  Exercises - Brandt-Daroff Vestibular Exercise  - 1 x daily - 5 x weekly - 2 sets - 3-5 reps    PATIENT EDUCATION: Education details: review/consolidation of HEP and discussion on remaining positions that may bring on symptoms, reviewed universal repositioning maneuver, informed pt on  conversion of posterior to horizontal canal, with resolution upon end of session. Person educated: Patient Education method: Explanation, Demonstration, Tactile cues, Verbal cues, and Handouts Education comprehension: verbalized understanding and returned demonstration     04/24/23: VESTIBULAR ASSESSMENT   GENERAL OBSERVATION: pt wears readers (hx of cataract surgery)   OCULOMOTOR EXAM:   Ocular Alignment: normal   Ocular ROM: No Limitations   Spontaneous Nystagmus: absent   Gaze-Induced Nystagmus: absent   Smooth Pursuits: intact   Saccades: intact   Convergence/Divergence: c/o diplopia at 1 foot away   VESTIBULAR - OCULAR REFLEX:    Slow VOR: Normal c/o  a little swirly; slow    VOR Cancellation: Normal   Head-Impulse Test: HIT Right: covert positive HIT Left: negative    POSITIONAL TESTING:  Right Roll Test: negative  Left Roll Test: negative  Right Sidelying: R upbeating torsional lasting ~25 sec; dizziness upon sitting up with reversal of nystagmus  Left Sidelying: L upbeating torsional lasting ~15 sec; dizziness upon sitting up  Right Dix-Hallpike: upbeating nystagmus lasting ~10 sec followed by downbeat nystagmus lasting ~15 sec    GOALS: Goals reviewed with patient? Yes  SHORT TERM GOALS: Target date: 03/31/2023  Patient will be independent with initial HEP. Baseline: Goal status: MET on 03/21/2023  2.  Patient will report at least a 25% improvement in dizziness symptoms. Baseline:  Goal status: MET   LONG TERM GOALS: Target date: 06/14/2023  Patient will be independent with advanced HEP to allow for self progression after discharge. Baseline:  Goal status: IN PROGRESS 05/17/23  2.  Patient will report at least a 75% improvement in dizziness to allow him to perform functional tasks without increased dizziness. Baseline: Reports 30% improvement on 04/13/23; reports 50% 05/17/23 Goal status: IN PROGRESS 05/17/23  3.  Patient will increase bilat hip  strength to at least 4+/5 to allow him to perform functional tasks with improve ease. Baseline: 4/5; improved but not met 05/17/23 Goal status: IN PROGRESS 05/17/23  4.  Patient will report pain of no increase in back pain with various typical household tasks and with community ambulation. Baseline: Reports LBP is not limiting him with ADLs any longer.  05/17/23 Goal status: MET 05/17/23  5.  Patient will report no falls or loss in balance within 2 weeks leading up to discharge. Baseline: denies falls in the past 2 weeks 05/17/23 Goal status: IN PROGRESS 05/17/23   ASSESSMENT:  CLINICAL IMPRESSION: Patient arrived to session with improved stability observed. Reports improved dizziness with daily tasks, however reports remaining symptoms with bed mobility. Reviewed HEP with no symptoms remaining with all exercises with exception of Wilhelmena Carrel. Reviewed and performed L universal repositioning maneuver as L side is still more symptomatic. Upon retesting, patient demonstrated L beating nystagmus, indicating conversion to horizonal canal. Treated with L BBQ roll. Retesting was negative, with a couple beats of nystagmus indicating some remaining L posterior canalithiasis. Patient left without complaints.  OBJECTIVE IMPAIRMENTS: Abnormal gait, decreased balance, decreased strength, increased muscle spasms, postural dysfunction, and pain.   ACTIVITY LIMITATIONS: carrying, lifting, bending, stairs, and bed mobility  PARTICIPATION LIMITATIONS: driving and community activity  PERSONAL FACTORS: Past/current experiences, Time since onset of injury/illness/exacerbation, and 3 comorbidities: HTN, cataract surgery, Hx of back pain are also affecting patient's functional outcome.   REHAB POTENTIAL: Good  CLINICAL DECISION MAKING: Evolving/moderate complexity  EVALUATION COMPLEXITY: Moderate   PLAN:  PT FREQUENCY: 1x/week  PT DURATION: 4 weeks  PLANNED INTERVENTIONS: Therapeutic  exercises, Therapeutic activity, Neuromuscular re-education, Balance training, Gait training, Patient/Family education, Self Care, Joint mobilization, Joint manipulation, Stair training, Vestibular training, Canalith repositioning, Aquatic Therapy, Dry Needling, Electrical stimulation, Spinal manipulation, Spinal mobilization, Cryotherapy, Moist heat, Taping, Traction, Ultrasound, Manual therapy, and Re-evaluation  PLAN FOR NEXT SESSION: f/u on CRM maneuver at home    Louana Terrilyn Christians, PT, DPT 06/08/23 2:45 PM  [facility] at [facility] [address], [address] [place], [place] [address] Phone # [phone] Fax # [phone]

## 2023-06-08 ENCOUNTER — Ambulatory Visit: Payer: Medicare HMO | Attending: Family Medicine | Admitting: Physical Therapy

## 2023-06-08 ENCOUNTER — Encounter: Payer: Self-pay | Admitting: Physical Therapy

## 2023-06-08 DIAGNOSIS — R293 Abnormal posture: Secondary | ICD-10-CM | POA: Diagnosis not present

## 2023-06-08 DIAGNOSIS — M5459 Other low back pain: Secondary | ICD-10-CM

## 2023-06-08 DIAGNOSIS — R2689 Other abnormalities of gait and mobility: Secondary | ICD-10-CM

## 2023-06-08 DIAGNOSIS — R42 Dizziness and giddiness: Secondary | ICD-10-CM | POA: Diagnosis not present

## 2023-06-08 DIAGNOSIS — M6281 Muscle weakness (generalized): Secondary | ICD-10-CM

## 2023-06-12 NOTE — Therapy (Signed)
 OUTPATIENT PHYSICAL THERAPY VESTIBULAR DISCHARGE     Patient Name: Kent Watts MRN: [id number] DOB:05-22-1951, 73 y.o., male Today's Date: 06/13/2023   Progress Note Reporting Period 05/22/23 to 06/13/23  See note below for Objective Data and Assessment of Progress/Goals.      END OF SESSION:  PT End of Session - 06/13/23 1433     Visit Number 19    Number of Visits 19    Date for PT Re-Evaluation 06/14/23    Authorization Type [employer]    Progress Note Due on Visit 20    PT Start Time 1405    PT Stop Time 1433    PT Time Calculation (min) 28 min    Activity Tolerance Patient tolerated treatment well    Behavior During Therapy WFL for tasks assessed/performed                      Past Medical History:  Diagnosis Date   Hypertension    Past Surgical History:  Procedure Laterality Date   KNEE SURGERY Right    There are no active problems to display for this patient.    REFERRING PROVIDER: Lynwood Laneta ORN, PA-C  REFERRING DIAG: R29.6 (ICD-10-CM) - Frequent falls  THERAPY DIAG:  Dizziness and giddiness  Abnormal posture  Other abnormalities of gait and mobility  Muscle weakness (generalized)  Other low back pain  ONSET DATE: 5 weeks ago  Rationale for Evaluation and Treatment: Rehabilitation  SUBJECTIVE:   SUBJECTIVE STATEMENT: Basically it's kind of the same. It's not terrible. Reports a hard time getting motivated to be more active. Reports that he is not interested in exercise. Reports 50% improvement in dizziness since initial appointment. Reports that he has performed universal repositioning maneuver at home and reports no particular change.    Pt accompanied by: self  PERTINENT HISTORY: HTN, cataract surgery  PAIN:  Are you having pain? Yes: NPRS scale: 0/10 Pain location: right shoulder Pain description: aching and throbbing Aggravating factors: certain positions and worse first thing in the AM Relieving factors:  unknown  PRECAUTIONS: Fall  RED FLAGS: None   WEIGHT BEARING RESTRICTIONS: No  FALLS: Has patient fallen in last 6 months? Yes. 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  Is trying not to do too much moving of items, and Leisure: spending time with granddaughter  PATIENT GOALS: To improve balance and decrease dizziness.  OBJECTIVE:    TODAY'S TREATMENT: 06/13/23 Activity Comments  Sitting hip MMT Flexion  Abduction Adduction   4+/5 R, 4/5 L L 4+/5, R 4+/5 L 4/5, R 4+/5  L sidelying test L upbeating torsional nystagmus lasting 5 sec  R sidelying test  R upbeating torsional nystagmus lasting 5 sec              PATIENT EDUCATION: Education details: discussion with pt encouraging increasing activity levels while finding something he enjoys, using principles of habit pairing; encouraged him to reach out to his insurance company to see if he qualifies for free [employer]; encouraged continued HEP for habituation d/t improving tolerance, discussion on remaining impairments and progress towards goals  Person educated:  Patient Education method: Explanation Education comprehension: verbalized understanding    HOME EXERCISE PROGRAM Access Code: [email or web address] URL: [email or web address] Date: 06/08/2023 Prepared by: [facility] - Outpatient  Rehab - [facility]  Program Notes perform this exercise daily. if dizziness is particularly bad, perform the Universal Repositioning Maneuver provided.  Exercises - Brandt-Daroff Vestibular Exercise  - 1 x daily - 5 x weekly - 2 sets - 3-5  reps    04/24/23: VESTIBULAR ASSESSMENT   GENERAL OBSERVATION: pt wears readers (hx of cataract surgery)   OCULOMOTOR EXAM:   Ocular Alignment: normal   Ocular ROM: No Limitations   Spontaneous Nystagmus: absent   Gaze-Induced Nystagmus: absent   Smooth Pursuits: intact   Saccades: intact   Convergence/Divergence: c/o diplopia at 1 foot away   VESTIBULAR - OCULAR REFLEX:    Slow VOR: Normal c/o  a little swirly; slow    VOR Cancellation: Normal   Head-Impulse Test: HIT Right: covert positive HIT Left: negative    POSITIONAL TESTING:  Right Roll Test: negative  Left Roll Test: negative  Right Sidelying: R upbeating torsional lasting ~25 sec; dizziness upon sitting up with reversal of nystagmus  Left Sidelying: L upbeating torsional lasting ~15 sec; dizziness upon sitting up  Right Dix-Hallpike: upbeating nystagmus lasting ~10 sec followed by downbeat nystagmus lasting ~15 sec    GOALS: Goals reviewed with patient? Yes  SHORT TERM GOALS: Target date: 03/31/2023  Patient will be independent with initial HEP. Baseline: Goal status: MET on 03/21/2023  2.  Patient will report at least a 25% improvement in dizziness symptoms. Baseline:  Goal status: MET   LONG TERM GOALS: Target date: 06/14/2023  Patient will be independent with advanced HEP to allow for self progression after discharge. Baseline:  Goal status: MET 06/13/23  2.  Patient will report at least a 75% improvement in dizziness to allow him to perform functional tasks without increased dizziness. Baseline: Reports 30% improvement on 04/13/23; reports 50% 05/17/23; reports 50% improved 06/13/23 Goal status: NOT MET 06/13/23  3.  Patient will increase bilat hip strength to at least 4+/5 to allow him to perform functional tasks with improve ease. Baseline: 4/5; improved but not met 05/17/23; improved but not met 06/13/23 Goal status: NOT MET 06/13/23  4.  Patient will report pain of no increase in back pain with  various typical household tasks and with community ambulation. Baseline: Reports LBP is not limiting him with ADLs any longer.  05/17/23 Goal status: MET 05/17/23  5.  Patient will report no falls or loss in balance within 2 weeks leading up to discharge. Baseline: denies falls in the past 2 weeks 05/17/23; reports no falls in past 2 weeks 06/13/23 Goal status: MET 06/13/23  ASSESSMENT:  CLINICAL IMPRESSION: Patient arrived to session with report of 50% improvement in dizziness since initial session. Hip strength is still slightly limited on L side. Positional testing revealed milder and shorter duration of symptoms on B sides. Denies falls in the past 2 weeks. At this time, patient has made progress towards more goals and dizziness is more tolerable despite not having full resolution. Patient reported understanding of HEP for habituation and self-CRM. Ready for DC at this time.     OBJECTIVE IMPAIRMENTS: Abnormal gait, decreased balance, decreased strength, increased muscle spasms, postural dysfunction, and pain.   ACTIVITY LIMITATIONS: carrying, lifting, bending, stairs, and bed mobility  PARTICIPATION LIMITATIONS: driving and community activity  PERSONAL FACTORS: Past/current experiences, Time since onset of injury/illness/exacerbation, and 3 comorbidities: HTN, cataract  surgery, Hx of back pain are also affecting patient's functional outcome.   REHAB POTENTIAL: Good  CLINICAL DECISION MAKING: Evolving/moderate complexity  EVALUATION COMPLEXITY: Moderate   PLAN:  PT FREQUENCY: 1x/week  PT DURATION: 4 weeks  PLANNED INTERVENTIONS: Therapeutic exercises, Therapeutic activity, Neuromuscular re-education, Balance training, Gait training, Patient/Family education, Self Care, Joint mobilization, Joint manipulation, Stair training, Vestibular training, Canalith repositioning, Aquatic Therapy, Dry Needling, Electrical stimulation, Spinal manipulation, Spinal mobilization, Cryotherapy,  Moist heat, Taping, Traction, Ultrasound, Manual therapy, and Re-evaluation  PLAN FOR NEXT SESSION: DC at this time    PHYSICAL THERAPY DISCHARGE SUMMARY  Visits from Start of Care: 19  Current functional level related to goals / functional outcomes: See above clinical impression    Remaining deficits: Remaining positional dizziness, hip weakness   Education / Equipment: HEP and eud on self-CRM  Plan: Patient agrees to discharge.  Patient goals were partially met. Patient is being discharged due to reaching max benefit from PT.        Louana Terrilyn Christians, PT, DPT 06/13/23 2:34 PM  [facility] [address] [place], [address] [place], [place] [address] Phone # [phone] Fax # [phone]

## 2023-06-13 ENCOUNTER — Ambulatory Visit: Payer: Medicare HMO | Admitting: Physical Therapy

## 2023-06-13 ENCOUNTER — Encounter: Payer: Self-pay | Admitting: Physical Therapy

## 2023-06-13 DIAGNOSIS — R2689 Other abnormalities of gait and mobility: Secondary | ICD-10-CM | POA: Diagnosis not present

## 2023-06-13 DIAGNOSIS — M6281 Muscle weakness (generalized): Secondary | ICD-10-CM

## 2023-06-13 DIAGNOSIS — R42 Dizziness and giddiness: Secondary | ICD-10-CM | POA: Diagnosis not present

## 2023-06-13 DIAGNOSIS — R293 Abnormal posture: Secondary | ICD-10-CM | POA: Diagnosis not present

## 2023-06-13 DIAGNOSIS — M5459 Other low back pain: Secondary | ICD-10-CM | POA: Diagnosis not present

## 2023-06-15 DIAGNOSIS — H52223 Regular astigmatism, bilateral: Secondary | ICD-10-CM | POA: Diagnosis not present

## 2023-06-15 DIAGNOSIS — H5203 Hypermetropia, bilateral: Secondary | ICD-10-CM | POA: Diagnosis not present

## 2023-07-13 DIAGNOSIS — F419 Anxiety disorder, unspecified: Secondary | ICD-10-CM | POA: Diagnosis not present

## 2023-07-13 DIAGNOSIS — F3341 Major depressive disorder, recurrent, in partial remission: Secondary | ICD-10-CM | POA: Diagnosis not present

## 2023-07-13 DIAGNOSIS — Z5181 Encounter for therapeutic drug level monitoring: Secondary | ICD-10-CM | POA: Diagnosis not present

## 2023-07-13 DIAGNOSIS — R7303 Prediabetes: Secondary | ICD-10-CM | POA: Diagnosis not present

## 2023-07-13 DIAGNOSIS — L219 Seborrheic dermatitis, unspecified: Secondary | ICD-10-CM | POA: Diagnosis not present

## 2023-07-13 DIAGNOSIS — Z23 Encounter for immunization: Secondary | ICD-10-CM | POA: Diagnosis not present

## 2023-07-13 DIAGNOSIS — Z1321 Encounter for screening for nutritional disorder: Secondary | ICD-10-CM | POA: Diagnosis not present

## 2023-07-13 DIAGNOSIS — E781 Pure hyperglyceridemia: Secondary | ICD-10-CM | POA: Diagnosis not present

## 2023-07-13 DIAGNOSIS — R42 Dizziness and giddiness: Secondary | ICD-10-CM | POA: Diagnosis not present

## 2023-07-13 DIAGNOSIS — K219 Gastro-esophageal reflux disease without esophagitis: Secondary | ICD-10-CM | POA: Diagnosis not present

## 2023-07-13 DIAGNOSIS — Z0000 Encounter for general adult medical examination without abnormal findings: Secondary | ICD-10-CM | POA: Diagnosis not present

## 2023-07-13 DIAGNOSIS — I10 Essential (primary) hypertension: Secondary | ICD-10-CM | POA: Diagnosis not present

## 2023-07-13 DIAGNOSIS — R972 Elevated prostate specific antigen [PSA]: Secondary | ICD-10-CM | POA: Diagnosis not present

## 2023-07-13 DIAGNOSIS — N400 Enlarged prostate without lower urinary tract symptoms: Secondary | ICD-10-CM | POA: Diagnosis not present

## 2023-07-20 IMAGING — CT CT ABD-PELV W/ CM
2 of 5 series · 16 of 46 positions shown, 18 images · IV contrast (APPLIED)
Comparison: None.

CLINICAL DATA: Abdominal pain, acute, nonlocalized

EXAM:
CT ABDOMEN AND PELVIS WITH CONTRAST
TECHNIQUE: Multidetector CT imaging of the abdomen and pelvis was performed
using the standard protocol following bolus administration of
intravenous contrast.

[Series 2: abd pel w · axial · 0.79mm/px · z∈[+886,+1266]mm · 13 of 86 slices shown, 15 images]
[im 5/86  soft-tissue]
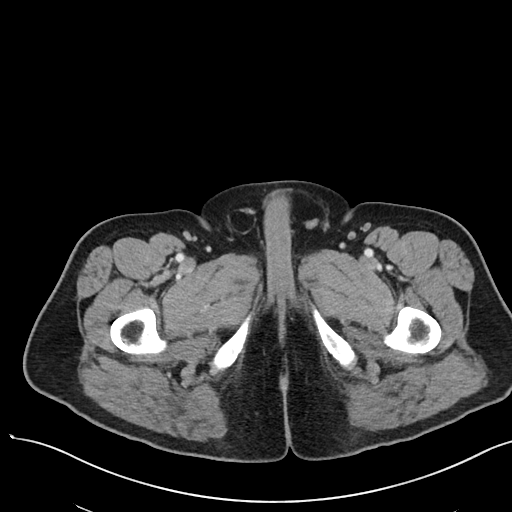
[im 5/86  bone]
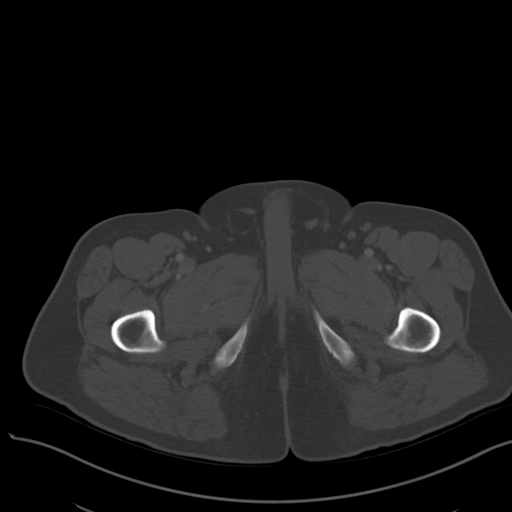
[im 10/86  soft-tissue]
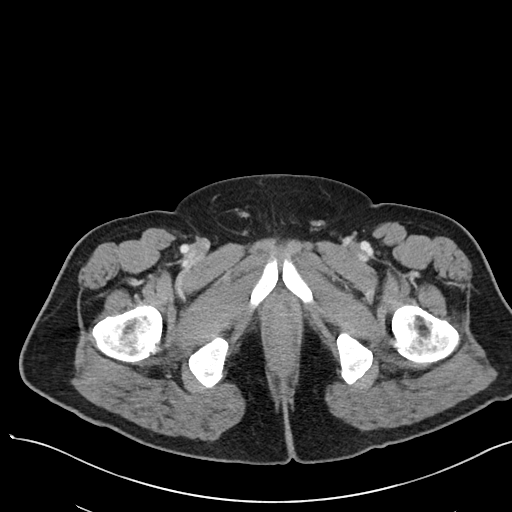
[im 19/86  soft-tissue]
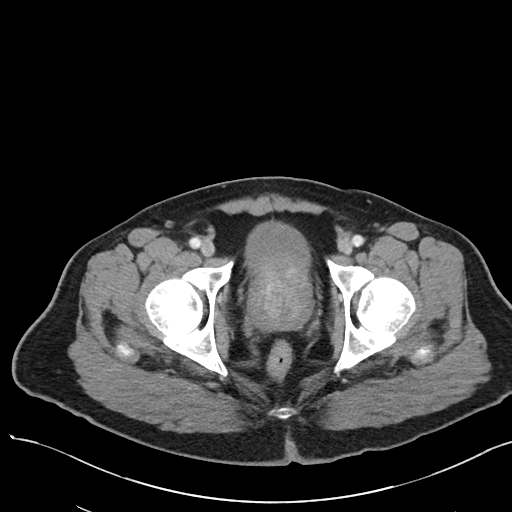
[im 24/86  soft-tissue]
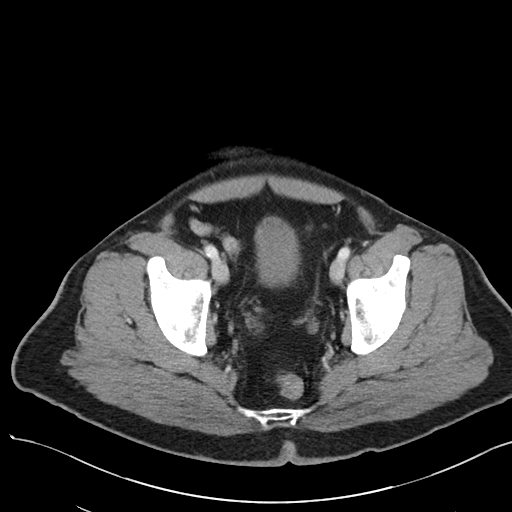
[im 29/86  soft-tissue]
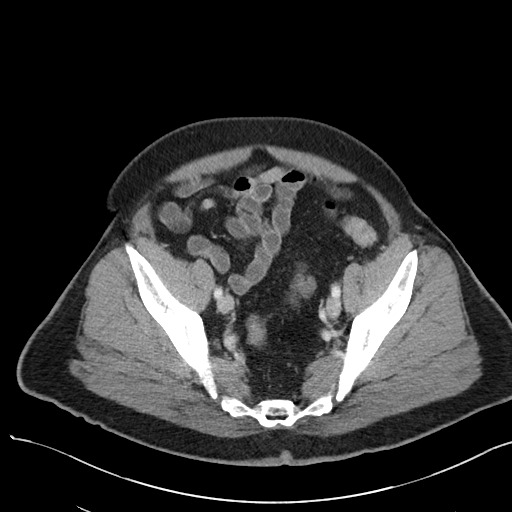
[im 38/86  soft-tissue]
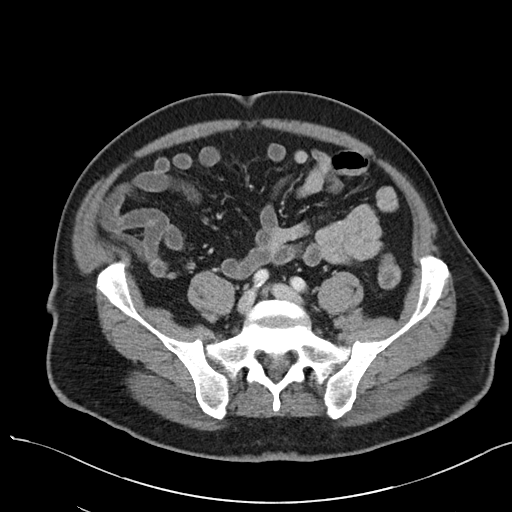
[im 43/86  soft-tissue]
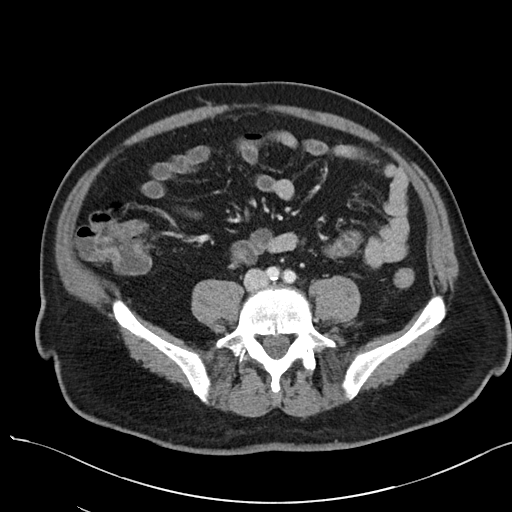
[im 48/86  soft-tissue]
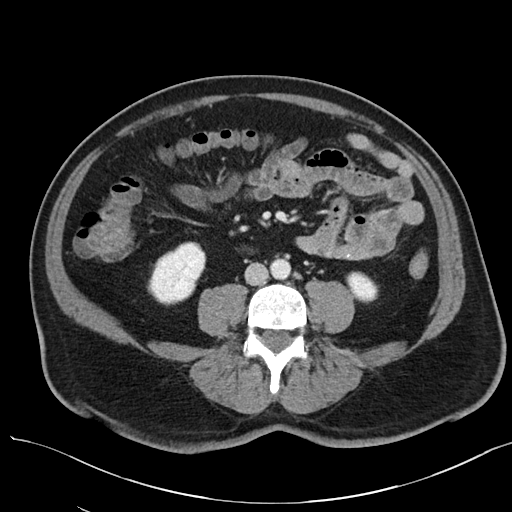
[im 57/86  soft-tissue]
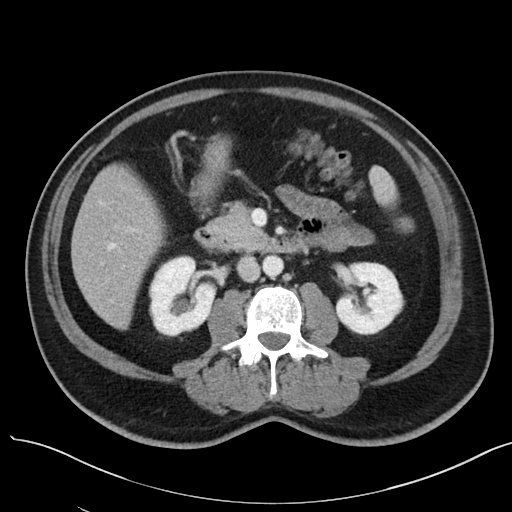
[im 57/86  bone]
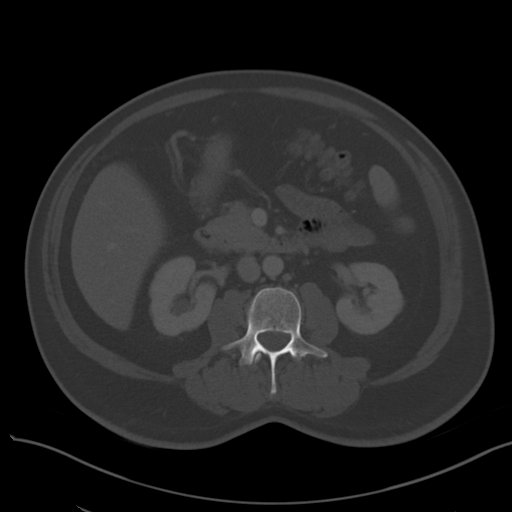
[im 62/86  soft-tissue]
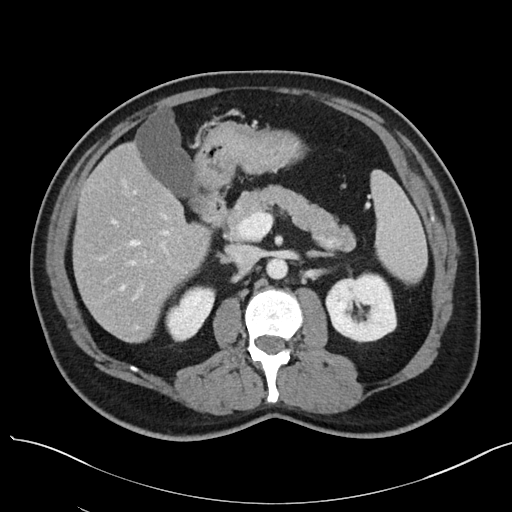
[im 67/86  soft-tissue]
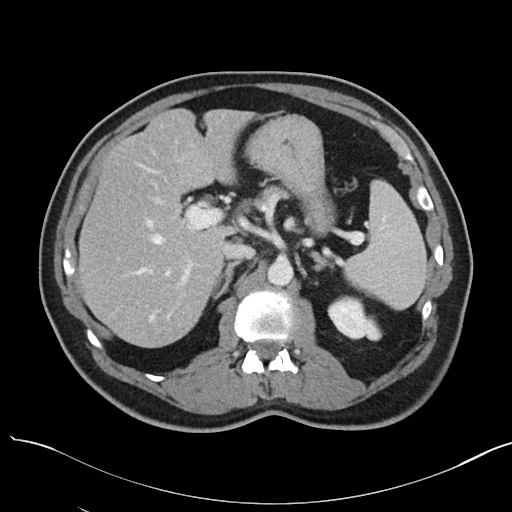
[im 76/86  soft-tissue]
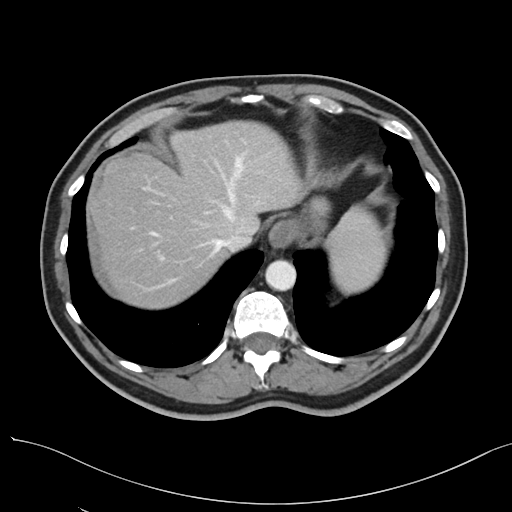
[im 81/86  soft-tissue]
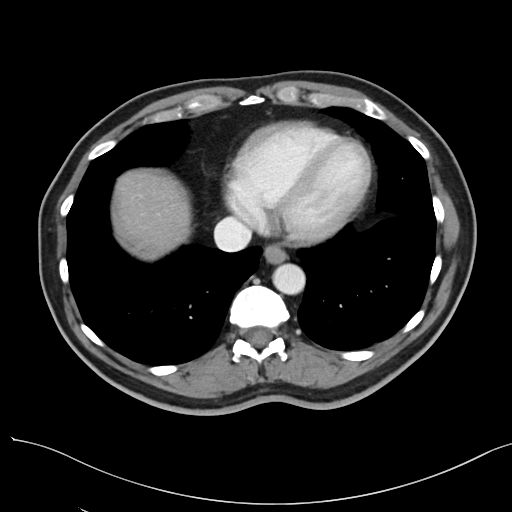

[Series 5: coronal · coronal · 0.81mm/px · 3 of 114 slices shown]
[im 38/114  soft-tissue]
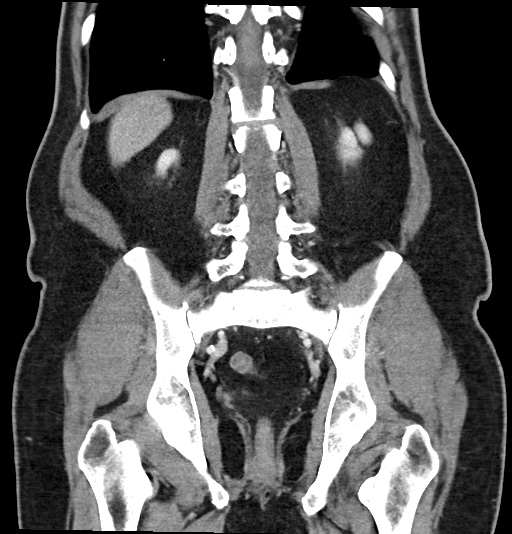
[im 51/114  soft-tissue]
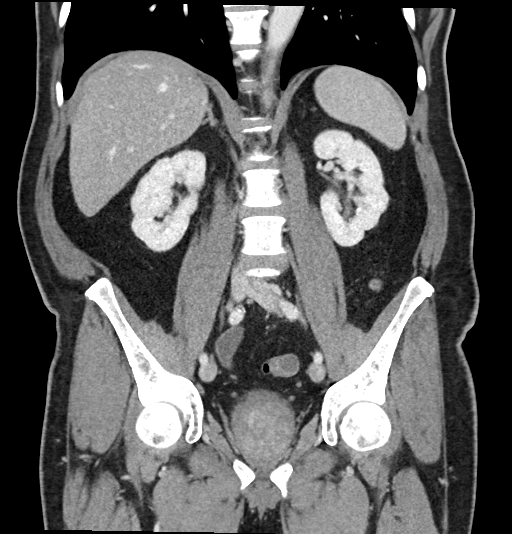
[im 63/114  soft-tissue]
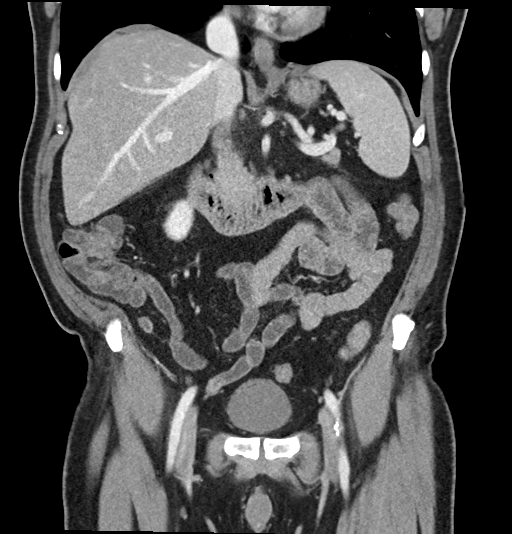

[16 of 46 positions shown; findings below may reference images not displayed]

RADIATION DOSE REDUCTION: This exam was performed according to the
departmental dose-optimization program which includes automated
exposure control, adjustment of the mA and/or kV according to
patient size and/or use of iterative reconstruction technique.

CONTRAST:  100mL OMNIPAQUE IOHEXOL 300 MG/ML  SOLN
FINDINGS: Lower chest: No acute abnormality. Small fat containing left
Bochdalek hernia.

Hepatobiliary: No focal liver abnormality. No gallstones,
gallbladder wall thickening, or pericholecystic fluid. No biliary
dilatation.

Pancreas: No focal lesion. Normal pancreatic contour. No surrounding
inflammatory changes. No main pancreatic ductal dilatation.

Spleen: Normal in size without focal abnormality.

Adrenals/Urinary Tract:

No adrenal nodule bilaterally.

Bilateral kidneys enhance symmetrically.

Punctate calcification within the right kidney. No left
nephrolithiasis. No hydronephrosis. No hydroureter.

The urinary bladder is unremarkable.

Stomach/Bowel: Stomach is within normal limits. No evidence of bowel
wall thickening or dilatation. Appendix appears normal.

Vascular/Lymphatic: No abdominal aorta or iliac aneurysm. Mild to
moderate atherosclerotic plaque of the aorta and its branches. No
abdominal, pelvic, or inguinal lymphadenopathy.

Reproductive: Prominent prostate measuring up to 4.7 cm.

Other: No intraperitoneal free fluid. No intraperitoneal free gas.
No organized fluid collection.

Musculoskeletal:

No abdominal wall hernia or abnormality.

No suspicious lytic or blastic osseous lesions. No acute displaced
fracture.
IMPRESSION: 1. Nonobstructive punctate right nephrolithiasis.
2. Prominent prostate measuring up to 4.7 cm.
3.  Aortic Atherosclerosis ([id number]-8M5.5).

## 2023-10-15 ENCOUNTER — Encounter (HOSPITAL_BASED_OUTPATIENT_CLINIC_OR_DEPARTMENT_OTHER): Payer: Self-pay | Admitting: Emergency Medicine

## 2023-10-15 ENCOUNTER — Emergency Department (HOSPITAL_BASED_OUTPATIENT_CLINIC_OR_DEPARTMENT_OTHER)

## 2023-10-15 ENCOUNTER — Emergency Department (HOSPITAL_BASED_OUTPATIENT_CLINIC_OR_DEPARTMENT_OTHER): Admission: EM | Admit: 2023-10-15 | Discharge: 2023-10-15 | Disposition: A | Discharge status: Home / Self Care

## 2023-10-15 ENCOUNTER — Other Ambulatory Visit: Payer: Self-pay

## 2023-10-15 DIAGNOSIS — N200 Calculus of kidney: Secondary | ICD-10-CM | POA: Diagnosis not present

## 2023-10-15 DIAGNOSIS — R109 Unspecified abdominal pain: Secondary | ICD-10-CM | POA: Diagnosis not present

## 2023-10-15 DIAGNOSIS — N132 Hydronephrosis with renal and ureteral calculous obstruction: Secondary | ICD-10-CM | POA: Diagnosis not present

## 2023-10-15 DIAGNOSIS — D72829 Elevated white blood cell count, unspecified: Secondary | ICD-10-CM | POA: Diagnosis not present

## 2023-10-15 DIAGNOSIS — K4090 Unilateral inguinal hernia, without obstruction or gangrene, not specified as recurrent: Secondary | ICD-10-CM | POA: Diagnosis not present

## 2023-10-15 DIAGNOSIS — N400 Enlarged prostate without lower urinary tract symptoms: Secondary | ICD-10-CM | POA: Diagnosis not present

## 2023-10-15 LAB — COMPREHENSIVE METABOLIC PANEL WITH GFR
ALT: 14 U/L (ref 0–44)
AST: 23 U/L (ref 15–41)
Albumin: 4.4 g/dL (ref 3.5–5.0)
Alkaline Phosphatase: 99 U/L (ref 38–126)
Anion gap: 16 — ABNORMAL HIGH (ref 5–15)
BUN: 21 mg/dL (ref 8–23)
CO2: 22 mmol/L (ref 22–32)
Calcium: 9.8 mg/dL (ref 8.9–10.3)
Chloride: 99 mmol/L (ref 98–111)
Creatinine, Ser: 1.54 mg/dL — ABNORMAL HIGH (ref 0.61–1.24)
GFR, Estimated: 48 mL/min — ABNORMAL LOW (ref >60)
Glucose, Bld: 139 mg/dL — ABNORMAL HIGH (ref 70–99)
Potassium: 3.5 mmol/L (ref 3.5–5.1)
Sodium: 136 mmol/L (ref 135–145)
Total Bilirubin: 1.2 mg/dL (ref 0.0–1.2)
Total Protein: 7.2 g/dL (ref 6.5–8.1)

## 2023-10-15 LAB — URINALYSIS, ROUTINE W REFLEX MICROSCOPIC
Bacteria, UA: NONE SEEN
Bilirubin Urine: NEGATIVE
Glucose, UA: NEGATIVE mg/dL
Ketones, ur: 40 mg/dL — AB
Leukocytes,Ua: NEGATIVE
Nitrite: NEGATIVE
Protein, ur: 30 mg/dL — AB
Specific Gravity, Urine: 1.024 (ref 1.005–1.030)
pH: 6.5 (ref 5.0–8.0)

## 2023-10-15 LAB — CBC
HCT: 40.9 % (ref 39.0–52.0)
Hemoglobin: 14.6 g/dL (ref 13.0–17.0)
MCH: 32.2 pg (ref 26.0–34.0)
MCHC: 35.7 g/dL (ref 30.0–36.0)
MCV: 90.1 fL (ref 80.0–100.0)
Platelets: 251 10*3/uL (ref 150–400)
RBC: 4.54 MIL/uL (ref 4.22–5.81)
RDW: 12 % (ref 11.5–15.5)
WBC: 14 10*3/uL — ABNORMAL HIGH (ref 4.0–10.5)
nRBC: 0 % (ref 0.0–0.2)

## 2023-10-15 LAB — LIPASE, BLOOD: Lipase: 21 U/L (ref 11–51)

## 2023-10-15 MED ORDER — OXYCODONE-ACETAMINOPHEN 5-325 MG PO TABS: 1.0000 | ORAL_TABLET | Freq: Four times a day (QID) | ORAL | 0 refills | Status: AC | PRN

## 2023-10-15 MED ORDER — KETOROLAC TROMETHAMINE 15 MG/ML IJ SOLN
15.0000 mg | Freq: Once | INTRAMUSCULAR | Status: AC
  Administered 2023-10-15: 15 mg via INTRAVENOUS
  Filled 2023-10-15: qty 1

## 2023-10-15 MED ORDER — LACTATED RINGERS IV BOLUS
1000.0000 mL | Freq: Once | INTRAVENOUS | Status: AC
  Administered 2023-10-15: 1000 mL via INTRAVENOUS

## 2023-10-15 MED ORDER — ONDANSETRON 4 MG PO TBDP: 4.0000 mg | ORAL_TABLET | Freq: Three times a day (TID) | ORAL | 0 refills | Status: AC | PRN

## 2023-10-15 MED ORDER — ONDANSETRON HCL 4 MG/2ML IJ SOLN
4.0000 mg | Freq: Once | INTRAMUSCULAR | Status: AC
  Administered 2023-10-15: 4 mg via INTRAVENOUS
  Filled 2023-10-15: qty 2

## 2023-10-15 NOTE — ED Notes (Addendum)
 Pt is unable to provide a urine sample at this time, pt is aware that a sample is needed, will ask and try again at a later time.

## 2023-10-15 NOTE — Discharge Instructions (Addendum)
 Take 1000 g of Tylenol alternating with 400 mg of ibuprofen every 4 hours for baseline pain control.  We are prescribing you narcotic pain medications for breakthrough pain.  He may use them as needed.  We are also prescribing you nausea medications that she can take as needed.  Please follow-up with your primary doctor.  Return if develop fevers, chills, severe pain, uncontrolled nausea vomiting, lightheadedness, passout, chest pain, shortness of breath or any new or worsening symptoms that are concerning to you.

## 2023-10-15 NOTE — ED Notes (Signed)
 Dc instructions reviewed with patient. Patient voiced understanding. Dc with belongings.

## 2023-10-15 NOTE — ED Triage Notes (Signed)
 Yesterday not feeling well, no appetite, pt poor historian. Fatigue,right flank pain since yesterday.

## 2023-10-15 NOTE — ED Provider Notes (Signed)
  EMERGENCY DEPARTMENT AT [facility] Provider Note   CSN: [id number] Arrival date & time: 10/15/23  7829     History  Chief Complaint  Patient presents with   Abdominal Pain    Kent Watts is a 73 y.o. male.  This is a 73 year old male presenting emergency department with right flank pain.  Symptoms started yesterday afternoon.  Constant, but vary in intensity.  Associated nausea, but no vomiting.  Decreased p.o. intake.  Last bowel movement was yesterday.  No prior abdominal surgeries.  Denies dysuria, frequency, hematuria.  No fevers, chest pain, shortness of breath.   Abdominal Pain      Home Medications Prior to Admission medications   Medication Sig Start Date End Date Taking? Authorizing Provider  ondansetron  (ZOFRAN -ODT) 4 MG disintegrating tablet Take 1 tablet (4 mg total) by mouth every 8 (eight) hours as needed for nausea or vomiting. 10/15/23  Yes Rolinda Climes, DO  oxyCODONE-acetaminophen (PERCOCET/ROXICET) 5-325 MG tablet Take 1 tablet by mouth every 6 (six) hours as needed for severe pain (pain score 7-10). 10/15/23  Yes Elise Guile J, DO  amLODipine (NORVASC) 5 MG tablet Take 5 mg by mouth daily.    [provider]  calcium-vitamin D (OSCAL WITH D) 500-5 MG-MCG tablet Take 1 tablet by mouth 2 (two) times daily.    [provider]  citalopram (CELEXA) 20 MG tablet Take 20 mg by mouth daily.    [provider]  cyanocobalamin (VITAMIN B12) 1000 MCG tablet Take 1,000 mcg by mouth daily.    [provider]  desonide (DESOWEN) 0.05 % cream Apply topically 2 (two) times daily.    [provider]  diazepam (VALIUM) 5 MG tablet Take 5 mg by mouth every 6 (six) hours as needed for anxiety.    [provider]  ketorolac (ACULAR) 0.5 % ophthalmic solution 1 drop 4 (four) times daily. Patient not taking: Reported on 03/09/2023    [provider]  loperamide  (IMODIUM ) 2 MG capsule Take 1  capsule (2 mg total) by mouth 4 (four) times daily as needed for diarrhea or loose stools. 08/23/21   Trish Furl, MD  metoprolol tartrate (LOPRESSOR) 50 MG tablet Take 50 mg by mouth 2 (two) times daily.    [provider]  ofloxacin (FLOXIN) 0.3 % OTIC solution 5 drops daily. Patient not taking: Reported on 03/09/2023    [provider]  omega-3 acid ethyl esters (LOVAZA) 1 g capsule Take by mouth 2 (two) times daily.    [provider]  omeprazole (PRILOSEC) 20 MG capsule Take 20 mg by mouth daily.    [provider]  prednisoLONE acetate (PRED FORTE) 1 % ophthalmic suspension 1 drop 4 (four) times daily. Patient not taking: Reported on 03/09/2023    [provider]  tamsulosin (FLOMAX) 0.4 MG CAPS capsule Take 0.4 mg by mouth.    [provider]      Allergies    Lisinopril    Review of Systems   Review of Systems  Gastrointestinal:  Positive for abdominal pain.    Physical Exam Updated Vital Signs BP (!) 150/70   Pulse 66   Temp 99.1 F (37.3 C) (Oral)   Resp 20   SpO2 98%  Physical Exam Vitals and nursing note reviewed.  Constitutional:      General: He is not in acute distress.    Appearance: He is not toxic-appearing.  HENT:     Head: Normocephalic.  Cardiovascular:  Rate and Rhythm: Normal rate and regular rhythm.  Pulmonary:     Effort: Pulmonary effort is normal.     Breath sounds: Normal breath sounds.  Abdominal:     General: Abdomen is flat.     Palpations: Abdomen is soft.     Tenderness: There is no abdominal tenderness.  Skin:    Capillary Refill: Capillary refill takes less than 2 seconds.  Neurological:     General: No focal deficit present.     Mental Status: He is alert.  Psychiatric:        Mood and Affect: Mood normal.        Behavior: Behavior normal.     ED Results / Procedures / Treatments   Labs (all labs ordered are listed, but only abnormal results are displayed) Labs Reviewed   CBC - Abnormal; Notable for the following components:      Result Value   WBC 14.0 (*)    All other components within normal limits  COMPREHENSIVE METABOLIC PANEL WITH GFR - Abnormal; Notable for the following components:   Glucose, Bld 139 (*)    Creatinine, Ser 1.54 (*)    GFR, Estimated 48 (*)    Anion gap 16 (*)    All other components within normal limits  URINALYSIS, ROUTINE W REFLEX MICROSCOPIC - Abnormal; Notable for the following components:   Hgb urine dipstick SMALL (*)    Ketones, ur 40 (*)    Protein, ur 30 (*)    All other components within normal limits  LIPASE, BLOOD    EKG None  Radiology CT Renal Stone Study Result Date: 10/15/2023 CLINICAL DATA:  Abdominal/flank pain. EXAM: CT ABDOMEN AND PELVIS WITHOUT CONTRAST TECHNIQUE: Multidetector CT imaging of the abdomen and pelvis was performed following the standard protocol without IV contrast. RADIATION DOSE REDUCTION: This exam was performed according to the departmental dose-optimization program which includes automated exposure control, adjustment of the mA and/or kV according to patient size and/or use of iterative reconstruction technique. COMPARISON:  08/23/2021 FINDINGS: Lower chest: No acute abnormality. Hepatobiliary: No focal liver abnormality is seen. No gallstones, gallbladder wall thickening, or biliary dilatation. Pancreas: Unremarkable. No pancreatic ductal dilatation or surrounding inflammatory changes. Spleen: Normal in size without focal abnormality. Adrenals/Urinary Tract: Normal adrenal glands. The left kidney appears normal. There is right-sided hydronephrosis with perinephric fat stranding. At the right UPJ there is a stone measuring 4 mm, image 56/4 and image 42/2. No additional urinary tract calculi identified. Partially decompressed thick-walled urinary bladder is identified. Stomach/Bowel: Stomach is within normal limits. Appendix appears normal. No evidence of bowel wall thickening, distention, or  inflammatory changes. Vascular/Lymphatic: Aortic atherosclerosis. No enlarged abdominal or pelvic lymph nodes. Reproductive: Prostate gland is enlarged with mass effect upon the base of bladder. Other: Small fat containing right inguinal hernia. No abdominopelvic ascites. Musculoskeletal: No acute or significant osseous findings. IMPRESSION: 1. Right-sided hydronephrosis with perinephric fat stranding. At the right UPJ there is a 4 mm stone. 2. Prostatomegaly with mass effect upon the base of bladder. 3. Small fat containing right inguinal hernia. 4.  Aortic Atherosclerosis (ICD10-I70.0). Electronically Signed   By: Kimberley Penman M.D.   On: 10/15/2023 08:52    Procedures Procedures    Medications Ordered in ED Medications  ketorolac (TORADOL) 15 MG/ML injection 15 mg (15 mg Intravenous Given 10/15/23 0836)  ondansetron  (ZOFRAN ) injection 4 mg (4 mg Intravenous Given 10/15/23 0836)  lactated ringers bolus 1,000 mL (1,000 mLs Intravenous New Bag/Given 10/15/23 0843)  ED Course/ Medical Decision Making/ A&P Clinical Course as of 10/15/23 1110  Sun Oct 15, 2023  1914 CT Renal [place] I do not appreciate free air on my independent review [TY]  0840 CBC(!) Does have leukocytosis.  No fever no tachycardia. [TY]  0840 Comprehensive metabolic panel(!) Elevated creatinine compared to prior [TY]  0840 Urinalysis, Routine w reflex microscopic -Urine, Clean Catch(!) Does have hematuria.  No evidence of infection [TY]  0840 Lipase: 21 Pancreatitis unlikely [TY]  0858 CT Renal Stone Study IMPRESSION: 1. Right-sided hydronephrosis with perinephric fat stranding. At the right UPJ there is a 4 mm stone. 2. Prostatomegaly with mass effect upon the base of bladder. 3. Small fat containing right inguinal hernia. 4.  Aortic Atherosclerosis (ICD10-I70.0).   Electronically Signed   By: Kimberley Penman M.D.   On: 10/15/2023 08:52   [TY]  0905 Feeling improved. Stable for discharge.  [TY]     Clinical Course User Index [TY] Rolinda Climes, DO                                 Medical Decision Making 73 year old male presents emergency department with right flank pain.  Afebrile, vital signs reassuring.  Benign abdominal exam.  Per chart review has a history of hypertension and anxiety.  No prior abdominal surgery per chart review as well.  Waxing and waning symptoms, concern for possible stone.  CT scan does show small obstructing stone.  Labs as noted in ED course.  Patient hydrated with IV fluids, pain under control with Toradol.  Stable for discharge.  Follow-up primary doctor.  Amount and/or Complexity of Data Reviewed Independent Historian:     Details: Spouse thinks husband has IBS and that we will have abdominal pain and nausea/vomiting when stressed, and thinks he has been stressed the past several days. External Data Reviewed: radiology.    Details: CT abd in 2023: "IMPRESSION: 1. Nonobstructive punctate right nephrolithiasis. 2. Prominent prostate measuring up to 4.7 cm. 3.  Aortic Atherosclerosis (ICD10-I70.0). " Labs: ordered. Decision-making details documented in ED Course. Radiology: ordered and independent interpretation performed. Decision-making details documented in ED Course.  Risk Prescription drug management. Decision regarding hospitalization. Diagnosis or treatment significantly limited by social determinants of health. Risk Details: Poor health literacy          Final Clinical Impression(s) / ED Diagnoses Final diagnoses:  Kidney stone    Rx / DC Orders ED Discharge Orders          Ordered    oxyCODONE-acetaminophen (PERCOCET/ROXICET) 5-325 MG tablet  Every 6 hours PRN        10/15/23 0946    ondansetron  (ZOFRAN -ODT) 4 MG disintegrating tablet  Every 8 hours PRN        10/15/23 0946              Rolinda Climes, DO 10/15/23 1110

## 2023-10-16 DIAGNOSIS — N200 Calculus of kidney: Secondary | ICD-10-CM | POA: Diagnosis not present

## 2023-11-06 DIAGNOSIS — Z87442 Personal history of urinary calculi: Secondary | ICD-10-CM | POA: Diagnosis not present

## 2023-11-06 DIAGNOSIS — R972 Elevated prostate specific antigen [PSA]: Secondary | ICD-10-CM | POA: Diagnosis not present

## 2023-11-06 DIAGNOSIS — N201 Calculus of ureter: Secondary | ICD-10-CM | POA: Diagnosis not present

## 2023-11-06 DIAGNOSIS — N200 Calculus of kidney: Secondary | ICD-10-CM | POA: Diagnosis not present

## 2023-11-17 DIAGNOSIS — R972 Elevated prostate specific antigen [PSA]: Secondary | ICD-10-CM | POA: Diagnosis not present

## 2024-01-01 DIAGNOSIS — H0288A Meibomian gland dysfunction right eye, upper and lower eyelids: Secondary | ICD-10-CM | POA: Diagnosis not present

## 2024-01-01 DIAGNOSIS — H0288B Meibomian gland dysfunction left eye, upper and lower eyelids: Secondary | ICD-10-CM | POA: Diagnosis not present

## 2024-01-01 DIAGNOSIS — H5789 Other specified disorders of eye and adnexa: Secondary | ICD-10-CM | POA: Diagnosis not present

## 2024-01-01 DIAGNOSIS — Z961 Presence of intraocular lens: Secondary | ICD-10-CM | POA: Diagnosis not present

## 2024-01-01 DIAGNOSIS — H5203 Hypermetropia, bilateral: Secondary | ICD-10-CM | POA: Diagnosis not present

## 2024-01-01 DIAGNOSIS — H04123 Dry eye syndrome of bilateral lacrimal glands: Secondary | ICD-10-CM | POA: Diagnosis not present

## 2024-01-01 DIAGNOSIS — H524 Presbyopia: Secondary | ICD-10-CM | POA: Diagnosis not present

## 2024-01-09 DIAGNOSIS — I10 Essential (primary) hypertension: Secondary | ICD-10-CM | POA: Diagnosis not present

## 2024-01-09 DIAGNOSIS — E559 Vitamin D deficiency, unspecified: Secondary | ICD-10-CM | POA: Diagnosis not present

## 2024-01-09 DIAGNOSIS — R7303 Prediabetes: Secondary | ICD-10-CM | POA: Diagnosis not present

## 2024-01-09 DIAGNOSIS — E781 Pure hyperglyceridemia: Secondary | ICD-10-CM | POA: Diagnosis not present

## 2024-01-11 DIAGNOSIS — E781 Pure hyperglyceridemia: Secondary | ICD-10-CM | POA: Diagnosis not present

## 2024-01-11 DIAGNOSIS — F419 Anxiety disorder, unspecified: Secondary | ICD-10-CM | POA: Diagnosis not present

## 2024-01-11 DIAGNOSIS — I10 Essential (primary) hypertension: Secondary | ICD-10-CM | POA: Diagnosis not present

## 2024-01-11 DIAGNOSIS — R972 Elevated prostate specific antigen [PSA]: Secondary | ICD-10-CM | POA: Diagnosis not present

## 2024-01-11 DIAGNOSIS — R7303 Prediabetes: Secondary | ICD-10-CM | POA: Diagnosis not present

## 2024-01-11 DIAGNOSIS — N400 Enlarged prostate without lower urinary tract symptoms: Secondary | ICD-10-CM | POA: Diagnosis not present

## 2024-01-11 DIAGNOSIS — F3341 Major depressive disorder, recurrent, in partial remission: Secondary | ICD-10-CM | POA: Diagnosis not present

## 2024-01-11 DIAGNOSIS — F5101 Primary insomnia: Secondary | ICD-10-CM | POA: Diagnosis not present

## 2024-01-11 DIAGNOSIS — Z87442 Personal history of urinary calculi: Secondary | ICD-10-CM | POA: Diagnosis not present

## 2024-01-11 DIAGNOSIS — K219 Gastro-esophageal reflux disease without esophagitis: Secondary | ICD-10-CM | POA: Diagnosis not present

## 2024-01-11 DIAGNOSIS — Z5181 Encounter for therapeutic drug level monitoring: Secondary | ICD-10-CM | POA: Diagnosis not present

## 2024-01-11 DIAGNOSIS — L219 Seborrheic dermatitis, unspecified: Secondary | ICD-10-CM | POA: Diagnosis not present

## 2024-03-18 DIAGNOSIS — M25561 Pain in right knee: Secondary | ICD-10-CM | POA: Diagnosis not present

## 2024-03-18 DIAGNOSIS — M549 Dorsalgia, unspecified: Secondary | ICD-10-CM | POA: Diagnosis not present

## 2024-03-21 DIAGNOSIS — M25561 Pain in right knee: Secondary | ICD-10-CM | POA: Diagnosis not present

## 2024-03-21 DIAGNOSIS — M76899 Other specified enthesopathies of unspecified lower limb, excluding foot: Secondary | ICD-10-CM | POA: Diagnosis not present

## 2024-03-21 DIAGNOSIS — M76891 Other specified enthesopathies of right lower limb, excluding foot: Secondary | ICD-10-CM | POA: Diagnosis not present
